# Patient Record
Sex: Male | Born: 1955
Health system: Southern US, Community
[De-identification: ages and names within clinical notes are randomized; demographics above are authoritative.]

## PROBLEM LIST (undated history)

## (undated) DIAGNOSIS — R748 Abnormal levels of other serum enzymes: Secondary | ICD-10-CM

## (undated) DIAGNOSIS — E785 Hyperlipidemia, unspecified: Secondary | ICD-10-CM

## (undated) DIAGNOSIS — I2510 Atherosclerotic heart disease of native coronary artery without angina pectoris: Secondary | ICD-10-CM

## (undated) DIAGNOSIS — I219 Acute myocardial infarction, unspecified: Secondary | ICD-10-CM

## (undated) DIAGNOSIS — E611 Iron deficiency: Secondary | ICD-10-CM

## (undated) HISTORY — DX: Hyperlipidemia, unspecified: E78.5

## (undated) HISTORY — DX: Iron deficiency: E61.1

## (undated) HISTORY — DX: Abnormal levels of other serum enzymes: R74.8

---

## 1999-01-10 ENCOUNTER — Emergency Department (HOSPITAL_COMMUNITY): Admission: EM | Admit: 1999-01-10 | Discharge: 1999-01-10 | Payer: Self-pay | Admitting: Emergency Medicine

## 2005-01-22 ENCOUNTER — Emergency Department (HOSPITAL_COMMUNITY): Admission: EM | Admit: 2005-01-22 | Discharge: 2005-01-22 | Payer: Self-pay | Admitting: Emergency Medicine

## 2014-02-11 ENCOUNTER — Emergency Department (HOSPITAL_COMMUNITY): Payer: Self-pay

## 2014-02-11 ENCOUNTER — Emergency Department (HOSPITAL_COMMUNITY)
Admission: EM | Admit: 2014-02-11 | Discharge: 2014-02-11 | Disposition: A | Discharge status: Home / Self Care | Payer: Self-pay | Attending: Emergency Medicine | Admitting: Emergency Medicine

## 2014-02-11 ENCOUNTER — Encounter (HOSPITAL_COMMUNITY): Payer: Self-pay | Admitting: Emergency Medicine

## 2014-02-11 DIAGNOSIS — N451 Epididymitis: Secondary | ICD-10-CM

## 2014-02-11 DIAGNOSIS — R110 Nausea: Secondary | ICD-10-CM | POA: Insufficient documentation

## 2014-02-11 DIAGNOSIS — N453 Epididymo-orchitis: Secondary | ICD-10-CM | POA: Insufficient documentation

## 2014-02-11 DIAGNOSIS — F17200 Nicotine dependence, unspecified, uncomplicated: Secondary | ICD-10-CM | POA: Insufficient documentation

## 2014-02-11 LAB — URINALYSIS, ROUTINE W REFLEX MICROSCOPIC
Glucose, UA: NEGATIVE mg/dL
Hgb urine dipstick: NEGATIVE
Ketones, ur: 15 mg/dL — AB
NITRITE: NEGATIVE
Protein, ur: NEGATIVE mg/dL
SPECIFIC GRAVITY, URINE: 1.031 — AB (ref 1.005–1.030)
Urobilinogen, UA: 1 mg/dL (ref 0.0–1.0)
pH: 6 (ref 5.0–8.0)

## 2014-02-11 LAB — URINE MICROSCOPIC-ADD ON

## 2014-02-11 MED ORDER — FENTANYL CITRATE 0.05 MG/ML IJ SOLN
50.0000 ug | INTRAMUSCULAR | Status: AC | PRN
  Administered 2014-02-11 (×2): 50 ug via INTRAVENOUS
  Filled 2014-02-11: qty 2

## 2014-02-11 MED ORDER — CEFTRIAXONE SODIUM 250 MG IJ SOLR
250.0000 mg | Freq: Once | INTRAMUSCULAR | Status: AC
  Administered 2014-02-11: 250 mg via INTRAMUSCULAR
  Filled 2014-02-11: qty 250

## 2014-02-11 MED ORDER — ONDANSETRON HCL 4 MG/2ML IJ SOLN
4.0000 mg | INTRAMUSCULAR | Status: DC | PRN
  Administered 2014-02-11: 4 mg via INTRAVENOUS
  Filled 2014-02-11: qty 2

## 2014-02-11 MED ORDER — DOXYCYCLINE HYCLATE 100 MG PO TABS: 100.0000 mg | ORAL_TABLET | Freq: Two times a day (BID) | ORAL | Status: DC

## 2014-02-11 MED ORDER — OXYCODONE-ACETAMINOPHEN 5-325 MG PO TABS: ORAL_TABLET | ORAL | Status: DC

## 2014-02-11 MED ORDER — DOXYCYCLINE HYCLATE 100 MG PO TABS
100.0000 mg | ORAL_TABLET | Freq: Once | ORAL | Status: AC
  Administered 2014-02-11: 100 mg via ORAL
  Filled 2014-02-11: qty 1

## 2014-02-11 MED ORDER — LIDOCAINE HCL (PF) 1 % IJ SOLN
INTRAMUSCULAR | Status: AC
  Administered 2014-02-11: 1.5 mL
  Filled 2014-02-11: qty 5

## 2014-02-11 NOTE — ED Notes (Signed)
Patient transported to CT 

## 2014-02-11 NOTE — ED Provider Notes (Signed)
CSN: [id number]     Arrival date & time 02/11/14  1501 History   First MD Initiated Contact with Patient 02/11/14 1531     Chief Complaint  Patient presents with  . Groin Pain      HPI Pt was seen at 1545. Per pt, c/o sudden onset and persistence of constant right sided testicular "pain" for the past 3 to 4 days. Pt describes the pain as "throbbing," "tightness," "sharp," "shooting," and radiating into his right lower back.  Has been associated with nausea.  Denies left testicular pain/swelling, no dysuria/hematuria, no penile discharge, no abd pain, no vomiting/diarrhea, no black or blood in stools, no CP/SOB.     History reviewed. No pertinent past medical history.  History reviewed. No pertinent past surgical history.  History  Substance Use Topics  . Smoking status: Current Every Day Smoker    Types: Cigarettes  . Smokeless tobacco: Not on file  . Alcohol Use: Yes    Review of Systems ROS: Statement: All systems negative except as marked or noted in the HPI; Constitutional: Negative for fever and chills. ; ; Eyes: Negative for eye pain, redness and discharge. ; ; ENMT: Negative for ear pain, hoarseness, nasal congestion, sinus pressure and sore throat. ; ; Cardiovascular: Negative for chest pain, palpitations, diaphoresis, dyspnea and peripheral edema. ; ; Respiratory: Negative for cough, wheezing and stridor. ; ; Gastrointestinal: Negative for nausea, vomiting, diarrhea, abdominal pain, blood in stool, hematemesis, jaundice and rectal bleeding. . ; ; Genitourinary: Negative for dysuria, flank pain and hematuria. ; ; Genital:  No penile drainage or rash, +right testicular pain and swelling, no scrotal rash or swelling.;;  Musculoskeletal: +right sided LBP. Negative for neck pain. Negative for swelling and trauma.; ; Skin: Negative for pruritus, rash, abrasions, blisters, bruising and skin lesion.; ; Neuro: Negative for headache, lightheadedness and neck stiffness. Negative for  weakness, altered level of consciousness , altered mental status, extremity weakness, paresthesias, involuntary movement, seizure and syncope.     Allergies  Review of patient's allergies indicates no known allergies.  Home Medications   Prior to Admission medications   Medication Sig Start Date End Date Taking? Authorizing Provider  ibuprofen (ADVIL,MOTRIN) 200 MG tablet Take 400 mg by mouth every 6 (six) hours as needed for mild pain.   Yes Historical Provider, MD   BP 131/68  Pulse 100  Temp(Src) 99.1 F (37.3 C) (Oral)  Resp 19  Ht 5\' 10"  (1.778 m)  Wt 146 lb 8 oz (66.452 kg)  BMI 21.02 kg/m2  SpO2 100% Physical Exam 1550: Physical examination:  Nursing notes reviewed; Vital signs and O2 SAT reviewed;  Constitutional: Well developed, Well nourished, Well hydrated, In no acute distress; Head:  Normocephalic, atraumatic; Eyes: EOMI, PERRL, No scleral icterus; ENMT: Mouth and pharynx normal, Mucous membranes moist; Neck: Supple, Full range of motion, No lymphadenopathy; Cardiovascular: Regular rate and rhythm, No gallop; Respiratory: Breath sounds clear & equal bilaterally, No wheezes.  Speaking full sentences with ease, Normal respiratory effort/excursion; Chest: Nontender, Movement normal; Abdomen: Soft, Nontender, Nondistended, Normal bowel sounds; Genitourinary: No CVA tenderness. Genital exam performed with pt permission and male ED Tech chaperone present during exam. No perineal erythema.  No penile lesions or drainage.  No scrotal erythema, +right posterior scrotal edema and tenderness to palp. No rash. Normal testicular lie. +right posterior testicular tenderness to palp.  +cremasteric reflexes bilat.  No inguinal LAN or palpable masses.;; Spine:  No midline CS, TS, LS tenderness. +mild TTP right lower lumbar  paraspinal muscles. No rash.;; Extremities: Pulses normal, No tenderness, No edema, No calf edema or asymmetry.; Neuro: AA&Ox3, Major CN grossly intact.  Speech clear. No gross  focal motor or sensory deficits in extremities. Climbs on and off stretcher easily by himself. Gait steady.; Skin: Color normal, Warm, Dry.   ED Course  Procedures    MDM  MDM Reviewed: previous chart, nursing note and vitals Reviewed previous: labs Interpretation: labs, ultrasound and CT scan    Results for orders placed during the hospital encounter of 02/11/14  URINALYSIS, ROUTINE W REFLEX MICROSCOPIC      Result Value Ref Range   Color, Urine AMBER (*) YELLOW   APPearance HAZY (*) CLEAR   Specific Gravity, Urine 1.031 (*) 1.005 - 1.030   pH 6.0  5.0 - 8.0   Glucose, UA NEGATIVE  NEGATIVE mg/dL   Hgb urine dipstick NEGATIVE  NEGATIVE   Bilirubin Urine SMALL (*) NEGATIVE   Ketones, ur 15 (*) NEGATIVE mg/dL   Protein, ur NEGATIVE  NEGATIVE mg/dL   Urobilinogen, UA 1.0  0.0 - 1.0 mg/dL   Nitrite NEGATIVE  NEGATIVE   Leukocytes, UA SMALL (*) NEGATIVE  URINE MICROSCOPIC-ADD ON      Result Value Ref Range   Squamous Epithelial / LPF FEW (*) RARE   WBC, UA 7-10  <3 WBC/hpf   RBC / HPF 0-2  <3 RBC/hpf   Bacteria, UA RARE  RARE   Ct Abdomen Pelvis Wo Contrast 02/11/2014   CLINICAL DATA:  Right groin pain radiates into right flank and lower back  EXAM: CT ABDOMEN AND PELVIS WITHOUT CONTRAST  TECHNIQUE: Multidetector CT imaging of the abdomen and pelvis was performed following the standard protocol without IV contrast.  COMPARISON:  None.  FINDINGS: Visualized portions of the lung bases are clear. There is a small hiatal hernia.  Liver is normal. Gallbladder is normal. Spleen is normal. Pancreas is normal.  Adrenal glands are normal. Left kidney is normal. Right kidney is normal except for 1 mm nonobstructing stone in the lower pole. There is no nephroureterolithiasis or hydroureteronephrosis.  Bladder and reproductive organs are normal. There is no free fluid in the abdomen or pelvis. There is no significant adenopathy. Bowel and appendix appear normal. There are no acute  musculoskeletal findings.  Aorta is not dilated. There is mild bilateral iliac calcification. There is minimal calcification of the aorta.  IMPRESSION: No acute findings. Small hiatal hernia. 1 mm nonobstructing stone right kidney.   Electronically Signed   By: Esperanza Heir M.D.   On: 02/11/2014 17:05   US Scrotum 02/11/2014   CLINICAL DATA:  Right testicle pain, swelling.  EXAM: ULTRASOUND OF SCROTUM  TECHNIQUE: Complete ultrasound examination of the testicles, epididymis, and other scrotal structures was performed.  COMPARISON:  None.  FINDINGS: Right testicle  Measurements: 4.5 x 3.1 x 3.4 cm. No mass or microlithiasis visualized. Normal color Doppler flow.  Left testicle  Measurements: 4.3 x 2.4 x 2.6 cm. No mass or microlithiasis visualized. Normal color Doppler flow.  Right epididymis:  Increased blood flow, enlarged and heterogeneous.  Left epididymis:  3 small cysts, less than 1 cm maximally  Hydrocele:  Bilateral, right larger than left  Varicocele:  Mild left varicocele.  IMPRESSION: Enlarged, hyperemic right epididymis compatible with epididymitis.  Bilateral hydroceles right greater than left.  Left varicocele.   Electronically Signed   By: Charlett Nose M.D.   On: 02/11/2014 17:00    1750:   Feels better after pain meds  and wants to go home now. Will tx with IM rocephin, PO doxycycline for right epididymitis. Dx and testing d/w pt and family.  Questions answered.  Verb understanding, agreeable to d/c home with outpt f/u.   Laray AngerKathleen M McManus, DO 02/13/14 1537

## 2014-02-11 NOTE — ED Notes (Signed)
Pt to ultrasound

## 2014-02-11 NOTE — ED Notes (Signed)
[facility]

## 2014-02-11 NOTE — ED Notes (Signed)
He states hes noticed mild pain in his groin but today the pain became so severe he can not sit down and he is having difficulty voiding.

## 2014-02-11 NOTE — Discharge Instructions (Signed)
°Emergency Department Resource Guide °1) Find a Doctor and Pay Out of Pocket °Although you won't have to find out who is covered by your insurance plan, it is a good idea to ask around and get recommendations. You will then need to call the office and see if the doctor you have chosen will accept you as a new patient and what types of options they offer for patients who are self-pay. Some doctors offer discounts or will set up payment plans for their patients who do not have insurance, but you will need to ask so you aren't surprised when you get to your appointment. ° °2) Contact Your Local Health Department °Not all health departments have doctors that can see patients for sick visits, but many do, so it is worth a call to see if yours does. If you don't know where your local health department is, you can check in your phone book. The CDC also has a tool to help you locate your state's health department, and many state websites also have listings of all of their local health departments. ° °3) Find a Walk-in Clinic °If your illness is not likely to be very severe or complicated, you may want to try a walk in clinic. These are popping up all over the country in pharmacies, drugstores, and shopping centers. They're usually staffed by nurse practitioners or physician assistants that have been trained to treat common illnesses and complaints. They're usually fairly quick and inexpensive. However, if you have serious medical issues or chronic medical problems, these are probably not your best option. ° °No Primary Care Doctor: °- Call Health Connect at  832-8000 - they can help you locate a primary care doctor that  accepts your insurance, provides certain services, etc. °- Physician Referral Service- 1-800-533-3463 ° °Chronic Pain Problems: °Organization         Address  Phone   Notes  °[facility]  ([phone]) [phone] Patients need to be referred by their primary care doctor.  ° °Medication  Assistance: °Organization         Address  Phone   Notes  °[place] [facility] [address] °[place], [place] [address] (336) 641-8030 --Must be a resident of [place] °-- Must have NO insurance coverage whatsoever (no Medicaid/ Medicare, etc.) °-- The pt. MUST have a primary care doctor that directs their care regularly and follows them in the community °  °[facility]  (866) 331-1348   °[employer]  (888) 892-1162   ° °Agencies that provide inexpensive medical care: °Organization         Address  Phone   Notes  °[facility]  (336) 832-8035   °[facility]    (336) 832-7272   °[facility] [address] °[place], [place] [address] (336) 832-4777   °[facility] [address], °[place] (336) 271-4999   °[facility]    (336) 373-0678   °[facility]    (336) 272-1050   °[facility] ° [address], [place] Phone:  (336) 832-4444, Fax:  (336) 832-4440 Hours of Operation:  9 am - 6 pm, M-F.  Also accepts Medicaid/Medicare and self-pay.  °[facility] ° [address], [place] Phone: (336) [phone], Fax: (336) [phone]. Hours of Operation:  8:30 am - 5:30 pm, M-F.  Also accepts Medicaid and self-pay.  °[facility] [place] [address], [place] Phone: (336) 878-6027   °[facility] [address], [place], [place] (336)723-1848, Ext. 123 Mondays & Thursdays: 7-9 AM.  First 15 patients are seen on a first come, first serve basis. °  ° °Medicaid-accepting [place] Providers: ° °Organization         Address  Phone   Notes  °[facility] [address], [place] (336) 641-2100 Also accepts self-pay patients.  °[facility] [address], [place] ° (336) 856-9996   °[facility] [address], [place]  (336) 288-8857   °[facility] [address] [place] [address], [place] (336) 299-7000   °Veita Bland [address], [place]  ° (336) 373-1557 Only accepts [place] Access Medicaid patients after they have their name applied to their card.  ° °Self-Pay (no insurance) in [place]: ° °Organization         Address  Phone   Notes  °Sickle Cell Patients, [facility] [address], [place] (336) 832-1970   °[facility] [address], [place] (336) 832-4400   °[facility] ° [address], [place] (336) 992-4800   °[facility]/Dr. Osei-Bonsu ° [address] [place] [address], [place] or [address], [place] (336) [phone] Phone number for both [place] and [place] locations is the same.  °[facility] [address], [place] (336) 299-0000   °[facility] [address] [place] [address], [place] or [address] (336) 852-7530 °(336) 878-2260   °[facility] [address], [place] (336) 350-1642, phone; (336) 294-5005, fax Sees patients 1st and 3rd Saturday of every month.  Must not qualify for public or private insurance (i.e. Medicaid, Medicare, [place] Health Choice, Veterans' Benefits) • Household income should be no more than 200% of the poverty level •The clinic cannot treat you if you are pregnant or think you are pregnant • Sexually transmitted diseases are not treated at the clinic.  ° ° °Dental Care: °Organization         Address  Phone  Notes  °[place] [facility] [address], [place] (336) 641-6152 Accepts children up to age 21 who are enrolled in Medicaid or [place] Health Choice; pregnant women with a Medicaid card; and children who have applied for Medicaid or [place] Health Choice, but were declined, whose parents can pay a reduced fee at time of service.  °[place]  [facility] [place]  [address], [place] (336) 641-7733 Accepts children up to age 21 who are enrolled in Medicaid or [place] Health Choice; pregnant women with a Medicaid card; and children who have applied for Medicaid or [place] Health Choice, but were declined, whose parents can pay a reduced fee at time of service.  °[facility] ° [address], [place] (336) 641-4533 Patients are seen by appointment only. Walk-ins are not accepted. [facility] will see patients 18 years of age and older. °Monday - Tuesday (8am-5pm) °Most Wednesdays (8:30-5pm) °$30 per visit, cash only  °[facility] ° [address], [place] (336) 641-4533 Patients are seen by appointment only. Walk-ins are not accepted. [facility] will see patients 18 years of age and older. °One   Wednesday Evening (Monthly: Volunteer Based).  $30 per visit, cash only  °[facility]  (919) [phone] for adults; Children under age 4, call Graduate Pediatric Dentistry at ([phone]) [phone]. Children aged 4-14, please call ([phone]) [phone] to request a pediatric application. ° Dental services are provided in all areas of dental care including fillings, crowns and bridges, complete and partial dentures, implants, gum treatment, root canals, and extractions. Preventive care is also provided. Treatment is provided to both adults and children. °Patients are selected via a lottery and there is often a waiting list. °  °[facility] [address], °[place] ° (336) 763-8833 [email or web address] °  °[facility] [address], [place], [place] (336)723-1848, Ext. 123 Second and Fourth Thursday of each month, opens at 6:30 AM; Clinic ends at 9 AM.  Patients are seen on a first-come first-served basis, and a limited number are seen during each clinic.  ° °[facility] ° [address], [place], [place] (336) [phone]    Eligibility Requirements °You must have lived in [place], [place], or [place] counties for at least the last three months. °  You cannot be eligible for state or federal sponsored healthcare insurance, including Veterans Administration, Medicaid, or Medicare. °  You generally cannot be eligible for healthcare insurance through your employer.  °  How to apply: °Eligibility screenings are held every Tuesday and Wednesday afternoon from 1:00 pm until 4:00 pm. You do not need an appointment for the interview!  °[facility] [address], [place], [place] 336-631-2330   °[facility]  336-342-8273   °[facility]  336-703-3100   °[facility]  336-570-6415   ° °Behavioral Health Resources in the Community: °Intensive Outpatient Programs °Organization         Address  Phone  Notes  °[facility] [address], [place], [place] 336-878-6098   °[facility] [address], [place], [place] 336-832-9800   °[facility] [address], [place], [place] ° 336-882-2125   °[facility] [address],  °[place], [place] 1-800-853-5163 or 336-641-4981   °Substance Abuse Resources °Organization         Address  Phone  Notes  °[facility]  336-882-2125   °[facility]  336-784-9470   °[facility]  336-285-9073   °[facility]  336-845-3988   °Residential & Outpatient Substance Abuse Program  1-800-659-3381   °Psychological Services °Organization         Address  Phone  Notes  °[facility]  336- 832-9600   °[facility]  336- 378-7881   °[facility] [address], [place] 1-800-853-5163 or 336-641-4981   ° °Mobile Crisis Teams °Organization         Address  Phone  Notes  °[facility], Mobile Crisis Care Unit  1-877-626-1772   °Assertive °[facility] ° [address]  [place], [place] 336-834-9664   °Sharon DeEsch [address] °[place] 336-554-5454   ° °Self-Help/Support Groups °Organization         Address  Phone             Notes  °[facility] of  - variety of support groups  336- 373-1402 Call for more information  °Narcotics Anonymous (NA), [facility] [address], °[place]  2 meetings at this location  ° °  Residential Treatment Programs Organization         Address  Phone  Notes  [facility] [address],    [place]  [phone]   [facility]  [address], [place] [phone], [place], [place] [phone]   [facility] [address], [place] [phone] Admissions: 8am-3pm M-F  [facility] [address]    [place], [place] [phone]   [facility] [address], [place], [place] [phone]   [facility] [address]  [place], [place] [phone]   [facility] Intensive Outpatient [address], [place], [place] [phone]   [facility] [address]  [place], [place] [phone] or [phone](757)[phone]   [facility] [address] [place], [place] [phone]([phone] Accepts Medicaid  [facility] [address]  [place] [phone] Substance Abuse/Addiction Treatment   [facility] Organization         Address  Phone  Notes  [facility]  (815)482-8371(888) 707-543-8369   Angie FavaJulie Brannon, PhD [address], [address] [place], [place]   (657) [phone](336) [phone] or [phone](336) [phone]   [facility]   [address] [place], [place] [phone](336) [phone]   [facility] [address], [place], [place] ([phone]) [phone] Insurance/Medicaid/sponsorship through [facility] [address]                                    [place], [place] [phone]) [phone] Therapy/tele-psych/case    [facility] [address]   [place], [place] [phone]) [phone]    Dr. Lolly MustacheArfeen  [phone]) [phone]   [facility]  [employer] [facility] 1) [address],  2) [address], [place] 3)  [address], [place] ([phone](336) ([phone](336) ([phone]  [phone](336) [phone]   [facility] Child Abuse Hot[facility] 250-700-1392(336) [phone] or [phone](336) [phone] (After Hours)       Take the prescriptions as directed.  Call the Urologist on Monday to schedule a follow up appointment within the next 3 days.  Return to the Emergency Department immediately sooner if worsening.

## 2014-02-12 LAB — URINE CULTURE
Colony Count: NO GROWTH
Culture: NO GROWTH

## 2014-02-13 ENCOUNTER — Emergency Department (HOSPITAL_COMMUNITY)
Admission: EM | Admit: 2014-02-13 | Discharge: 2014-02-13 | Disposition: A | Discharge status: Home / Self Care | Payer: Self-pay | Attending: Emergency Medicine | Admitting: Emergency Medicine

## 2014-02-13 ENCOUNTER — Encounter (HOSPITAL_COMMUNITY): Payer: Self-pay | Admitting: Emergency Medicine

## 2014-02-13 DIAGNOSIS — F17200 Nicotine dependence, unspecified, uncomplicated: Secondary | ICD-10-CM | POA: Insufficient documentation

## 2014-02-13 DIAGNOSIS — I861 Scrotal varices: Secondary | ICD-10-CM | POA: Insufficient documentation

## 2014-02-13 DIAGNOSIS — N200 Calculus of kidney: Secondary | ICD-10-CM | POA: Insufficient documentation

## 2014-02-13 DIAGNOSIS — N451 Epididymitis: Secondary | ICD-10-CM

## 2014-02-13 DIAGNOSIS — N453 Epididymo-orchitis: Secondary | ICD-10-CM | POA: Insufficient documentation

## 2014-02-13 DIAGNOSIS — N433 Hydrocele, unspecified: Secondary | ICD-10-CM | POA: Insufficient documentation

## 2014-02-13 DIAGNOSIS — Z792 Long term (current) use of antibiotics: Secondary | ICD-10-CM | POA: Insufficient documentation

## 2014-02-13 LAB — URINALYSIS, ROUTINE W REFLEX MICROSCOPIC
Bilirubin Urine: NEGATIVE
GLUCOSE, UA: NEGATIVE mg/dL
KETONES UR: 15 mg/dL — AB
Nitrite: NEGATIVE
PROTEIN: 30 mg/dL — AB
Specific Gravity, Urine: 1.02 (ref 1.005–1.030)
Urobilinogen, UA: 1 mg/dL (ref 0.0–1.0)
pH: 5.5 (ref 5.0–8.0)

## 2014-02-13 LAB — URINE MICROSCOPIC-ADD ON

## 2014-02-13 MED ORDER — OXYCODONE-ACETAMINOPHEN 5-325 MG PO TABS
1.0000 | ORAL_TABLET | Freq: Once | ORAL | Status: AC
  Administered 2014-02-13: 1 via ORAL
  Filled 2014-02-13: qty 1

## 2014-02-13 NOTE — ED Notes (Signed)
Patient was going to be taken out by wheelchair but he refused. He was able to ambulate without difficulty and significant other was with him.

## 2014-02-13 NOTE — ED Provider Notes (Signed)
CSN: [id number]     Arrival date & time 02/13/14  5621 History   First MD Initiated Contact with Patient 02/13/14 1131     Chief Complaint  Patient presents with  . Testicle Pain     (Consider location/radiation/quality/duration/timing/severity/associated sxs/prior Treatment) The history is provided by the patient. No language interpreter was used.  Eddie Joseph is a 58 year old male with no known stent in past medical history presenting to the ED with testicular pain. Reported that he's noticed more swelling to the right side of his testicle, reported that the hardness says it is irritated, reported that the testicles more soft than it was when he was last in ED setting. Reported that he noticed discomfort radiating into his right groin. Stated that he felt mildly warm this morning-when asked what his temperature was, patient reported does not have a thermometer to take his temperature. Patient was last seen in ED setting on 02/11/2014 for similar complaints-CT abdomen and pelvis was performed with findings of the small hiatal hernia with right kidney stone identified. Ultrasound of the scrotum identified right epididymitis, bilateral hydroceles, left varicocele. Patient was given antibiotics in ED setting and discharged home with Doxy. As per patient, reported that he only took one dose of doxycycline yesterday. Denied ear edema, red streaks, nausea, vomiting, diarrhea, abdominal pain, numbness, tingling, headache, blurred vision, sudden loss of vision. PCP none  History reviewed. No pertinent past medical history. History reviewed. No pertinent past surgical history. No family history on file. History  Substance Use Topics  . Smoking status: Current Every Day Smoker    Types: Cigarettes  . Smokeless tobacco: Not on file  . Alcohol Use: Yes    Review of Systems  Constitutional: Positive for fever (Subjective). Negative for chills.  Respiratory: Negative for chest tightness and  shortness of breath.   Cardiovascular: Negative for chest pain.  Gastrointestinal: Negative for nausea, vomiting, abdominal pain and diarrhea.  Genitourinary: Positive for scrotal swelling and testicular pain. Negative for urgency, hematuria, flank pain, decreased urine volume, discharge, penile swelling and penile pain.  Musculoskeletal: Negative for back pain and neck pain.  Neurological: Negative for dizziness, weakness and headaches.      Allergies  Review of patient's allergies indicates no known allergies.  Home Medications   Prior to Admission medications   Medication Sig Start Date End Date Taking? Authorizing Provider  doxycycline (VIBRA-TABS) 100 MG tablet Take 1 tablet (100 mg total) by mouth 2 (two) times daily. 02/11/14  Yes Laray Anger, DO  ibuprofen (ADVIL,MOTRIN) 200 MG tablet Take 200-400 mg by mouth every 6 (six) hours as needed.   Yes Historical Provider, MD  oxyCODONE-acetaminophen (PERCOCET/ROXICET) 5-325 MG per tablet Take 1-2 tablets by mouth every 4 (four) hours as needed for severe pain.   Yes Historical Provider, MD   BP 152/80  Pulse 113  Temp(Src) 98.5 F (36.9 C) (Oral)  Resp 20  Ht 5\' 10"  (1.778 m)  Wt 147 lb (66.679 kg)  BMI 21.09 kg/m2  SpO2 97% Physical Exam  Nursing note and vitals reviewed. Constitutional: He is oriented to person, place, and time. He appears well-developed and well-nourished. No distress.  HENT:  Head: Normocephalic and atraumatic.  Mouth/Throat: Oropharynx is clear and moist. No oropharyngeal exudate.  Eyes: Conjunctivae and EOM are normal. Pupils are equal, round, and reactive to light. Right eye exhibits no discharge. Left eye exhibits no discharge.  Neck: Normal range of motion. Neck supple. No tracheal deviation present.  Negative  neck stiffness Negative nuchal rigidity Negative cervical lymphadenopathy Negative meningeal signs  Cardiovascular: Normal rate, regular rhythm and normal heart sounds.   Pulses:       Radial pulses are 2+ on the right side, and 2+ on the left side.       Dorsalis pedis pulses are 2+ on the right side, and 2+ on the left side.  Pulmonary/Chest: Effort normal and breath sounds normal. No respiratory distress. He has no wheezes. He has no rales.  Abdominal: Soft. Bowel sounds are normal. He exhibits no distension. There is no tenderness. There is no rebound and no guarding.  Negative abdominal distention Bowel sounds normal active in all 4 quadrants Abdomen soft upon palpation Negative pain upon palpation to the abdomen  Genitourinary:  Pelvic exam: Swelling, erythema, warmth upon palpation identified to the right testicle with most discomfort upon palpation to the posterior aspect. Negative high riding testicle identified. Mildly hard upon palpation, no sign of abscess noted. Negative fluctuance identified or induration. Mild hydrocele identified to the left testicle. Negative varicocele identified. Negative inguinal lymphadenopathy bilaterally. Negative penile swelling, discharge identified. Negative active bleeding identified. Exam chaperoned with tech  Musculoskeletal: Normal range of motion.  Full ROM to upper and lower extremities without difficulty noted, negative ataxia noted.  Lymphadenopathy:    He has no cervical adenopathy.  Neurological: He is alert and oriented to person, place, and time. No cranial nerve deficit. He exhibits normal muscle tone. Coordination normal.  Skin: Skin is warm and dry. No rash noted. He is not diaphoretic. No erythema.  Psychiatric: He has a normal mood and affect. His behavior is normal. Thought content normal.    ED Course  Procedures (including critical care time)  Results for orders placed during the hospital encounter of 02/13/14  URINALYSIS, ROUTINE W REFLEX MICROSCOPIC      Result Value Ref Range   Color, Urine YELLOW  YELLOW   APPearance CLEAR  CLEAR   Specific Gravity, Urine 1.020  1.005 - 1.030   pH 5.5  5.0 - 8.0    Glucose, UA NEGATIVE  NEGATIVE mg/dL   Hgb urine dipstick TRACE (*) NEGATIVE   Bilirubin Urine NEGATIVE  NEGATIVE   Ketones, ur 15 (*) NEGATIVE mg/dL   Protein, ur 30 (*) NEGATIVE mg/dL   Urobilinogen, UA 1.0  0.0 - 1.0 mg/dL   Nitrite NEGATIVE  NEGATIVE   Leukocytes, UA SMALL (*) NEGATIVE  URINE MICROSCOPIC-ADD ON      Result Value Ref Range   Squamous Epithelial / LPF RARE  RARE   WBC, UA 3-6  <3 WBC/hpf   RBC / HPF 0-2  <3 RBC/hpf   Bacteria, UA FEW (*) RARE    Labs Review Labs Reviewed  URINALYSIS, ROUTINE W REFLEX MICROSCOPIC - Abnormal; Notable for the following:    Hgb urine dipstick TRACE (*)    Ketones, ur 15 (*)    Protein, ur 30 (*)    Leukocytes, UA SMALL (*)    All other components within normal limits  URINE MICROSCOPIC-ADD ON - Abnormal; Notable for the following:    Bacteria, UA FEW (*)    All other components within normal limits    Imaging Review Ct Abdomen Pelvis Wo Contrast  02/11/2014   CLINICAL DATA:  Right groin pain radiates into right flank and lower back  EXAM: CT ABDOMEN AND PELVIS WITHOUT CONTRAST  TECHNIQUE: Multidetector CT imaging of the abdomen and pelvis was performed following the standard protocol without IV contrast.  COMPARISON:  None.  FINDINGS: Visualized portions of the lung bases are clear. There is a small hiatal hernia.  Liver is normal. Gallbladder is normal. Spleen is normal. Pancreas is normal.  Adrenal glands are normal. Left kidney is normal. Right kidney is normal except for 1 mm nonobstructing stone in the lower pole. There is no nephroureterolithiasis or hydroureteronephrosis.  Bladder and reproductive organs are normal. There is no free fluid in the abdomen or pelvis. There is no significant adenopathy. Bowel and appendix appear normal. There are no acute musculoskeletal findings.  Aorta is not dilated. There is mild bilateral iliac calcification. There is minimal calcification of the aorta.  IMPRESSION: No acute findings. Small  hiatal hernia. 1 mm nonobstructing stone right kidney.   Electronically Signed   By: Esperanza Heiraymond  Rubner M.D.   On: 02/11/2014 17:05   [place] Scrotum  02/11/2014   CLINICAL DATA:  Right testicle pain, swelling.  EXAM: ULTRASOUND OF SCROTUM  TECHNIQUE: Complete ultrasound examination of the testicles, epididymis, and other scrotal structures was performed.  COMPARISON:  None.  FINDINGS: Right testicle  Measurements: 4.5 x 3.1 x 3.4 cm. No mass or microlithiasis visualized. Normal color Doppler flow.  Left testicle  Measurements: 4.3 x 2.4 x 2.6 cm. No mass or microlithiasis visualized. Normal color Doppler flow.  Right epididymis:  Increased blood flow, enlarged and heterogeneous.  Left epididymis:  3 small cysts, less than 1 cm maximally  Hydrocele:  Bilateral, right larger than left  Varicocele:  Mild left varicocele.  IMPRESSION: Enlarged, hyperemic right epididymis compatible with epididymitis.  Bilateral hydroceles right greater than left.  Left varicocele.   Electronically Signed   By: Charlett NoseKevin  Dover M.D.   On: 02/11/2014 17:00     EKG Interpretation None      MDM   Final diagnoses:  Epididymitis  Bilateral hydrocele  Left varicocele  Right nephrolithiasis    Medications  oxyCODONE-acetaminophen (PERCOCET/ROXICET) 5-325 MG per tablet 1 tablet (1 tablet Oral Given 02/13/14 1256)   Filed Vitals:   02/13/14 1152 02/13/14 1200 02/13/14 1215 02/13/14 1230  BP: 145/90 138/77 140/78 152/80  Pulse:      Temp:      TempSrc:      Resp:      Height:      Weight:      SpO2:       This provider reviewed patient's chart. Patient was seen and assessed on 02/11/2014 regarding right testicular pain. Patient had a CT abdomen and pelvis performed with findings of hiatal hernia as well as a right kidney stone. Patient had an ultrasound of the scrotum performed identified enlarged right epididymitis as well as bilateral hydroceles, right greater than left as well as left varicocele. Negative findings of  testicular torsion noted. Patient was given antibiotics in ED setting and discharged home with doxycycline, Percocet, ibuprofen with recommendation for urology referral. Urinalysis identified trace of hemoglobin-patient does have findings of right nephrolithiasis. Small leukocytes identified with a blood cell count 3-6. Urine will be sent off for culturing. When compared to previous urine specimen from previous visit, unchanged.  Patient's heart rate stable, patient continues to move around when vitals are being performed. When resting heart rate is in the 90's. Patient had normal rhythm on exam.  Patient has known diagnosis of right epididymitis. Doubt torsion. This provider wanted to obtain bloodwork, patient refused labs to be performed in the ED setting, reported that he would like to go home. Patient stable, afebrile. Patient not septic appearing. Discussed case  with attending physician, Dr. Salley ScarletW. Plunkett who did not recommend labs and recommended patient to be discharge home with Urology follow-up. Patient discharged. Discussed with patient to continue to use antibiotics and pain medications as prescribed. Discussed importance of following up with Urology - to call tomorrow. Reported that he has pain medications at home. Discussed with patient to rest, elevate, and cool compressions. Discussed with patient to closely monitor symptoms and if symptoms are to worsen or change to report back to the ED - strict return instructions given.  Patient agreed to plan of care, understood, all questions answered.   Raymon MuttonMarissa Sciacca, PA-C 02/13/14 2059  Raymon MuttonMarissa Sciacca, PA-C 02/14/14 [phone]

## 2014-02-13 NOTE — ED Notes (Signed)
Patient is standing at his door expressing that he is in pain and needs pain medication. PA informed.

## 2014-02-13 NOTE — ED Notes (Addendum)
Patient is unable to sit still and states "I am ready to go home, I have my pain medication in my truck that I am ready to take". Informed patient that he still needs to take his medication as prescribed in correlation to the medication that we gave to him here. I asked him what pain medication he is taking he stated Ibuprofen.

## 2014-02-13 NOTE — ED Notes (Signed)
Pt dx with epididymis to right testicle on 6/19, started on pain medication and antibiotic. Pt reports decrease in pain but states that testicle remains swollen. Denies urinary symptoms. States testicle is soft. Reports cold sweat this AM. NAD.

## 2014-02-13 NOTE — Discharge Instructions (Signed)
Please call and set up an appointment with urology tomorrow Please rest and stay hydrated Please continue to take antibiotics as prescribed-please take on a full stomach Please continue to take pain medications as prescribed while on pain medications there is to be no drinking alcohol, driving, operating any heavy machinery if there is extra please dispose in a proper manner. Please do not take any extra Tylenol for this can lead to Tylenol overdose and liver issues. Please avoid any physical or shortness activity Please rest and elevate the testicle to aid in relief Please continue to monitor symptoms closely if symptoms are to worsen or change (fever greater than 101, chills, sweating, nausea, vomiting, stomach pain, groin pain, worsening or changes to testicle appearance, hot to the touch, penile discharge or bleeding, swelling to the penis, red extremities, numbness, tingling) please report back to the ED immediately   Epididymitis Epididymitis is a swelling (inflammation) of the epididymis. The epididymis is a cord-like structure along the back part of the testicle. Epididymitis is usually, but not always, caused by infection. This is usually a sudden problem beginning with chills, fever and pain behind the scrotum and in the testicle. There may be swelling and redness of the testicle. DIAGNOSIS  Physical examination will reveal a tender, swollen epididymis. Sometimes, cultures are obtained from the urine or from prostate secretions to help find out if there is an infection or if the cause is a different problem. Sometimes, blood work is performed to see if your white blood cell count is elevated and if a germ (bacterial) or viral infection is present. Using this knowledge, an appropriate medicine which kills germs (antibiotic) can be chosen by your caregiver. A viral infection causing epididymitis will most often go away (resolve) without treatment. HOME CARE INSTRUCTIONS   Hot sitz baths for 20  minutes, 4 times per day, may help relieve pain.  Only take over-the-counter or prescription medicines for pain, discomfort or fever as directed by your caregiver.  Take all medicines, including antibiotics, as directed. Take the antibiotics for the full prescribed length of time even if you are feeling better.  It is very important to keep all follow-up appointments. SEEK IMMEDIATE MEDICAL CARE IF:   You have a fever.  You have pain not relieved with medicines.  You have any worsening of your problems.  Your pain seems to come and go.  You develop pain, redness, and swelling in the scrotum and surrounding areas. MAKE SURE YOU:   Understand these instructions.  Will watch your condition.  Will get help right away if you are not doing well or get worse. Document Released: 08/09/2000 Document Revised: 11/04/2011 Document Reviewed: 06/29/2009 [facility] Patient Information 2015 [place], [place]. This information is not intended to replace advice given to you by your health care provider. Make sure you discuss any questions you have with your health care provider.  Kidney Stones Kidney stones (urolithiasis) are deposits that form inside your kidneys. The intense pain is caused by the stone moving through the urinary tract. When the stone moves, the ureter goes into spasm around the stone. The stone is usually passed in the urine.  CAUSES   A disorder that makes certain neck glands produce too much parathyroid hormone (primary hyperparathyroidism).  A buildup of uric acid crystals, similar to gout in your joints.  Narrowing (stricture) of the ureter.  A kidney obstruction present at birth (congenital obstruction).  Previous surgery on the kidney or ureters.  Numerous kidney infections. SYMPTOMS  Feeling sick to your stomach (nauseous).  Throwing up (vomiting).  Blood in the urine (hematuria).  Pain that usually spreads (radiates) to the groin.  Frequency or urgency of  urination. DIAGNOSIS   Taking a history and physical exam.  Blood or urine tests.  CT scan.  Occasionally, an examination of the inside of the urinary bladder (cystoscopy) is performed. TREATMENT   Observation.  Increasing your fluid intake.  Extracorporeal shock wave lithotripsy--This is a noninvasive procedure that uses shock waves to break up kidney stones.  Surgery may be needed if you have severe pain or persistent obstruction. There are various surgical procedures. Most of the procedures are performed with the use of small instruments. Only small incisions are needed to accommodate these instruments, so recovery time is minimized. The size, location, and chemical composition are all important variables that will determine the proper choice of action for you. Talk to your health care provider to better understand your situation so that you will minimize the risk of injury to yourself and your kidney.  HOME CARE INSTRUCTIONS   Drink enough water and fluids to keep your urine clear or pale yellow. This will help you to pass the stone or stone fragments.  Strain all urine through the provided strainer. Keep all particulate matter and stones for your health care provider to see. The stone causing the pain may be as small as a grain of salt. It is very important to use the strainer each and every time you pass your urine. The collection of your stone will allow your health care provider to analyze it and verify that a stone has actually passed. The stone analysis will often identify what you can do to reduce the incidence of recurrences.  Only take over-the-counter or prescription medicines for pain, discomfort, or fever as directed by your health care provider.  Make a follow-up appointment with your health care provider as directed.  Get follow-up X-rays if required. The absence of pain does not always mean that the stone has passed. It may have only stopped moving. If the urine  remains completely obstructed, it can cause loss of kidney function or even complete destruction of the kidney. It is your responsibility to make sure X-rays and follow-ups are completed. Ultrasounds of the kidney can show blockages and the status of the kidney. Ultrasounds are not associated with any radiation and can be performed easily in a matter of minutes. SEEK MEDICAL CARE IF:  You experience pain that is progressive and unresponsive to any pain medicine you have been prescribed. SEEK IMMEDIATE MEDICAL CARE IF:   Pain cannot be controlled with the prescribed medicine.  You have a fever or shaking chills.  The severity or intensity of pain increases over 18 hours and is not relieved by pain medicine.  You develop a new onset of abdominal pain.  You feel faint or pass out.  You are unable to urinate. MAKE SURE YOU:   Understand these instructions.  Will watch your condition.  Will get help right away if you are not doing well or get worse. Document Released: 08/12/2005 Document Revised: 04/14/2013 Document Reviewed: 01/13/2013 [facility] Patient Information 2015 [place], [place]. This information is not intended to replace advice given to you by your health care provider. Make sure you discuss any questions you have with your health care provider.   Emergency Department Resource Guide 1) Find a Doctor and Pay Out of Pocket Although you won't have to find out who is covered by your  insurance plan, it is a good idea to ask around and get recommendations. You will then need to call the office and see if the doctor you have chosen will accept you as a new patient and what types of options they offer for patients who are self-pay. Some doctors offer discounts or will set up payment plans for their patients who do not have insurance, but you will need to ask so you aren't surprised when you get to your appointment.  2) Contact Your Local Health Department Not all health departments have  doctors that can see patients for sick visits, but many do, so it is worth a call to see if yours does. If you don't know where your local health department is, you can check in your phone book. The CDC also has a tool to help you locate your state's health department, and many state websites also have listings of all of their local health departments.  3) Find a Walk-in Clinic If your illness is not likely to be very severe or complicated, you may want to try a walk in clinic. These are popping up all over the country in pharmacies, drugstores, and shopping centers. They're usually staffed by nurse practitioners or physician assistants that have been trained to treat common illnesses and complaints. They're usually fairly quick and inexpensive. However, if you have serious medical issues or chronic medical problems, these are probably not your best option.  No Primary Care Doctor: - Call Health Connect at  [phone] - they can help you locate a primary care doctor that  accepts your insurance, provides certain services, etc. - Physician Referral Service- [phone]  Chronic Pain Problems: Organization         Address  Phone   Notes  [facility]  [phone] Patients need to be referred by their primary care doctor.   Medication Assistance: Organization         Address  Phone   Notes  [facility]ication [facility] [address] [place], [place] [address] [phone]) [phone] --Must be a resident of [facility] [place] -- Must have NO insurance coverage whatsoever (no Medicaid/ Medicare, etc.) -- The pt. MUST have a primary care doctor that directs their care regularly and follows them in the community   MedAssist  [phone]) [phone]   [employer]  [phone]    Agencies that provide inexpensive medical care: Organization         Address  Phone   Notes  [facility]  [phone]) [phone]   [facility]    [phone]) [phone]    [facility] [address] [place], [place] [address] [phone]) [phone]   [facility] [address], [place] [phone]) [phone]   [facility]anned Parenthood    [phone](336) [phone]   [facility]    [phone]) [phone]   [facility]  [address], [place] Phone:  [phone]) [phone], Fax:  [phone] Hours of Operation:  9 am - 6 pm, M-F.  Also accepts Medicaid/Medicare and self-pay.  [facility]  [address],  Phone: [phone]) [phone], Fax: [phone]. Hours of Operation:  8:30 am - 5:30 pm, M-F.  Also accepts Medicaid and self-pay.  [facility] [place] [address], [place] Phone: [phone]) [phone]   [facility] [address], [place], [place] [phone], Ext. [phone] Mondays &  Thursdays: 7-9 AM.  First 15 patients are seen on a first come, first serve basis.    Medicaid-accepting [facility] Providers:  Organization         Address  Phone   Notes  [facility] [address], [place] [phone] Also accepts self-pay patients.  [facility] [address] Laurell Josephs [place], [place]  [phone]   [facility] [address], [place] [phone]   [facility] [address] [place] [phone]   Renaye Rakers [address], [place]   [phone] Only accepts [place] patients after they have their name applied to their card.   Self-Pay (no insurance) in [facility]:  Organization         Address  Phone   Notes  Sickle Cell Patients, [facility] [address] [place], [place] [phone]   [facility] Care [address] [place], [place] [phone]   [facility]  [address], [place] [phone]   [facility]/Dr. Osei-Bonsu  [address] [place] or [address], [place] [phone] Phone number for both [place] and [place] locations is the same.  [facility] [address], [place] [phone]   [facility] [address], [place] or [address] [phone]   [facility] [address], [place] [phone], phone; [phone], fax Sees patients 1st and 3rd Saturday of every month.  Must not qualify for public or private insurance (i.e. Medicaid, Medicare, [place] Health Choice, Veterans' Benefits)  Household income should be no more than 200% of the poverty level The clinic cannot treat you if you are pregnant or think you are pregnant  Sexually transmitted diseases are not treated at the clinic.    Dental Care: Organization         Address  Phone  Notes  [facility] Department of [facility] [address] [place], [place] [phone] Accepts children up to age 29 who are enrolled in [place] or [place] Health Choice; pregnant women with a Medicaid card; and children who have applied for Medicaid or [place] Health Choice, but were declined, whose parents can pay a reduced fee at time of service.  [facility] Department of [facility]  [address], [place] [phone] Accepts children up to age 21 who are enrolled in [place] or [place] Health Choice; pregnant women with a Medicaid card; and children who have applied for Medicaid or [place] Health Choice, but were declined, whose parents can pay a reduced fee at time of service.  [facility]  [address] [place], [place] [phone] Patients are seen by appointment only. Walk-ins are not accepted. [facility] will see patients 22 years of age and older. Monday - Tuesday (8am-5pm) Most Wednesdays (8:30-5pm) $30 per visit, cash only  [facility]  Adult Dental Access PROGRAM  [address] [place] [address], [facility] [phone] Patients are seen by appointment only. Walk-ins are not accepted. [facility] will see patients 27 years of age and older. One Wednesday Evening (Monthly: Volunteer Based).  $30 per visit, cash only  [employer]  [phone] for adults; Children under  age 72, call Graduate Pediatric Dentistry at [phone]. Children aged 70-14, please call [phone] to request a pediatric application.  Dental services are provided in all areas of dental care including fillings, crowns and bridges, complete and partial dentures, implants, gum treatment, root canals, and extractions. Preventive care is also provided. Treatment is provided to both adults and children. Patients are selected via a lottery and there is often a waiting list.   [facility] [address] [place]  [phone] [email or web address]   [facility] [address] [place], [place] [phone], Ext. [phone] Second and Fourth Thursday of each month, opens at 6:30 AM; Clinic ends at 9 AM.  Patients are seen on a first-come first-served basis, and a limited number are seen during each clinic.   [facility]  [address] Ether [place]ins [place], [place] [phone]   Eligibility Requirements You must have lived in [place], [place], or [place] counties for at least the last three months.   You cannot be eligible for state or federal sponsored [place], including [employer], [place], or [employer].   You generally cannot be eligible for healthcare insurance through your employer.    How to apply: Eligibility screenings are held every Tuesday and Wednesday afternoon from 1:00 pm until 4:00 pm. You do not need an appointment for the interview!  [facility] [address], [place], [place] [phone]   [facility] Health Department  [phone]   [facility] Health Department  [phone]   [facility] Health Department  [phone]    Behavioral Health Resources in the Community: Intensive Outpatient Programs Organization         Address  Phone  Notes  [facility] [address], [place], [place] [phone]   [facility] Outpatient [address] [place], [place] [phone]   [facility] [address] [place], [place]  [phone]   [facility] [address],  [place], [place] [phone] or [phone]   Substance Abuse Resources Organization         Address  Phone  Notes  [facility] Drug Services  [phone]   [facility]  [phone]   [facility] [place]  [phone]   Floydene Flock  [phone]   Residential & Outpatient Substance Abuse Program  [phone]   Psychological Services Organization         Address  Phone  Notes  [facility] Health  [phone]   [facility]  [phone]   [facility] Mental Health [address], [place] [phone] or [phone]    Mobile Crisis Teams Organization         Address  Phone  Notes  [facility], Mobile Crisis Care Unit  [phone]   [facility]  [address] [place], [place] [phone]   Doristine Locks [address] [place] [phone]    Self-Help/Support Groups Organization         Address  Phone             Notes  [facility] [place] - variety of support groups  336- I7437963 Call for more information  Narcotics Anonymous (NA), [facility] [address], [employer] [place]  2 meetings at this location   Chief Executive Officer  Notes  [facility] [phone] [address],    [place]  [phone]   [facility]  [address], [place] [phone], [place], [place] [phone]   [facility]  [facility]ility [address], [place] [phone] Admissions: 8am-3pm M-F  [facility] [address]    [place], [place] [phone]   [facility] [address], [place], [place] [phone]   [facility] [address]  [place], [place] [phone]   [facility] - Intensive Outpatient [address], [place], [place] [phone]   [facility] ([facility] [address]  [place], [place] [phone] or [phone]   [facility] ([facility]) [address] [place], [place] [phone] Accepts Medicaid  [facility] [address]  [place] [phone] Substance Abuse/Addiction Treatment   [facility] Behavioral Health Resources Organization         Address  Phone  Notes  [facility]  [phone](888) [phone]   Angie FavaJulie Brannon, PhD [address] [place], [place]   [phone]) [phone] or [phone](336) [phone]   [facility]   [address] [place], [place] [phone](336) [phone]   [facility] [address], [place], [place] [phone](336) [phone] Insurance/Medicaid/sponsorship through [facility]  [facility] Families [address]                                    [place], [place] [phone](336) [phone] Therapy/tele-psych/case  [facility] [address].   [place], [place] [phone](336) [phone]    Dr. Lolly MustacheArfeen  [phone]) [phone]   [facility]  [employer] [facility] 1) [address], [place] 2) [address], [place] 3)  [address], [place] [phone](336) [phone](336) [phone]  [phone](336) [phone]   [facility] Child Abuse Hotline [phone](336) [phone] or [phone](336) [phone] (After Hours)

## 2014-02-13 NOTE — ED Notes (Signed)
EDP and PA aware of patients increased heart rate.

## 2014-02-15 NOTE — ED Provider Notes (Signed)
Medical screening examination/treatment/procedure(s) were performed by non-physician practitioner and as supervising physician I was immediately available for consultation/collaboration.   EKG Interpretation None        Gwyneth SproutWhitney Plunkett, MD 02/15/14 [phone]

## 2014-06-04 ENCOUNTER — Emergency Department (HOSPITAL_COMMUNITY)
Admission: EM | Admit: 2014-06-04 | Discharge: 2014-06-04 | Disposition: A | Discharge status: Home / Self Care | Payer: Self-pay | Attending: Emergency Medicine | Admitting: Emergency Medicine

## 2014-06-04 ENCOUNTER — Encounter (HOSPITAL_COMMUNITY): Payer: Self-pay | Admitting: Emergency Medicine

## 2014-06-04 DIAGNOSIS — K006 Disturbances in tooth eruption: Secondary | ICD-10-CM | POA: Insufficient documentation

## 2014-06-04 DIAGNOSIS — Z720 Tobacco use: Secondary | ICD-10-CM | POA: Insufficient documentation

## 2014-06-04 DIAGNOSIS — K047 Periapical abscess without sinus: Secondary | ICD-10-CM

## 2014-06-04 DIAGNOSIS — K029 Dental caries, unspecified: Secondary | ICD-10-CM | POA: Insufficient documentation

## 2014-06-04 DIAGNOSIS — K044 Acute apical periodontitis of pulpal origin: Secondary | ICD-10-CM | POA: Insufficient documentation

## 2014-06-04 MED ORDER — HYDROCODONE-ACETAMINOPHEN 5-325 MG PO TABS: 1.0000 | ORAL_TABLET | ORAL | Status: DC | PRN

## 2014-06-04 MED ORDER — AMOXICILLIN 500 MG PO CAPS: 500.0000 mg | ORAL_CAPSULE | Freq: Three times a day (TID) | ORAL | Status: DC

## 2014-06-04 NOTE — Discharge Instructions (Signed)

## 2014-06-04 NOTE — ED Provider Notes (Signed)
CSN: [id number]     Arrival date & time 06/04/14  0621 History  This chart was scribed for Marlon Peliffany Mekaylah Klich, PA-C, working with Vanetta MuldersScott Zackowski, MD found by Elon SpannerGarrett Cook, ED Scribe. This patient was seen in room TR08C/TR08C and the patient's care was started at 9:08 AM.  Chief Complaint  Patient presents with  . Abscess    The history is provided by the patient. No language interpreter was used.   HPI Comments: Eddie Joseph is a [age] y.o. male who presents to the Emergency Department complaining of a gradually worsening painful area of swelling onset 3 days ago in his right lower dentition.  The patient reports the swelling was the first symptom to appear.  Patient denies taking any medications for the complaint.  Patient denies gargling with salt water.  Patient denies fever, vomiting, difficulty swallowing, CP.  NKA.     onHistory reviewed. No pertinent past medical history. History reviewed. No pertinent past surgical history. No family history on file. History  Substance Use Topics  . Smoking status: Current Every Day Smoker    Types: Cigarettes  . Smokeless tobacco: Not on file  . Alcohol Use: Yes    Review of Systems  HENT: Positive for dental problem.   All other systems reviewed and are negative.     Allergies  Review of patient's allergies indicates no known allergies.  Home Medications   Prior to Admission medications   Medication Sig Start Date End Date Taking? Authorizing Provider  ibuprofen (ADVIL,MOTRIN) 200 MG tablet Take 200-400 mg by mouth every 6 (six) hours as needed.   Yes Historical Provider, MD  amoxicillin (AMOXIL) 500 MG capsule Take 1 capsule (500 mg total) by mouth 3 (three) times daily. 06/04/14   Dorthula Matasiffany G Shaquavia Whisonant, PA-C  HYDROcodone-acetaminophen (NORCO/VICODIN) 5-325 MG per tablet Take 1-2 tablets by mouth every 4 (four) hours as needed. 06/04/14   Dorthula Matasiffany G Willer Osorno, PA-C   BP 120/57  Pulse 97  Temp(Src) 98.7 F (37.1 C) (Oral)  Resp 20  Ht 5'  10" (1.778 m)  Wt 145 lb (65.772 kg)  BMI 20.81 kg/m2  SpO2 99% Physical Exam  Nursing note and vitals reviewed. Constitutional: He appears well-developed and well-nourished.  HENT:  Head: Normocephalic and atraumatic.  Mouth/Throat: Oropharynx is clear and moist. No oral lesions. No trismus in the jaw. Abnormal dentition. Dental caries present. No dental abscesses or lacerations.    No sublingual swelling No obvious abscess on exam Widespread dental decay  Eyes: Conjunctivae and EOM are normal. Pupils are equal, round, and reactive to light.  Neck: Normal range of motion. Neck supple.  Cardiovascular: Normal rate and regular rhythm.   Pulmonary/Chest: Effort normal and breath sounds normal.    ED Course  Procedures (including critical care time)  DIAGNOSTIC STUDIES: Oxygen Saturation is 99% on RA, normal by my interpretation.    COORDINATION OF CARE:  9:11 AM Discussed plan to order pain medication and antibiotics.  Patient offered but denied having a dental block. Advised patient to follow-up with dentist.    Labs Review Labs Reviewed - No data to display  Imaging Review No results found.   EKG Interpretation None      MDM   Final diagnoses:  Dental infection    amoxicillin (AMOXIL) 500 MG capsule Take 1 capsule (500 mg total) by mouth 3 (three) times daily. 21 capsule Dorthula Matasiffany G Shakiya Mcneary, PA-C   HYDROcodone-acetaminophen (NORCO/VICODIN) 5-325 MG per tablet Take 1-2 tablets by mouth every 4 (four)  hours as needed. 20 tablet Dorthula Matasiffany G Kasyn Stouffer, PA-C  Patient has dental pain. No emergent s/sx's present. Patent airway. No trismus or airway compromise Will be given pain medication and antibiotics. I discussed the need to call dentist within 24/48 hours for follow-up. Dental referral given. Return to ED precautions given.  Pt voiced understanding and has agreed to follow-up.   58 y.o.Eddie Joseph's evaluation in the Emergency Department is complete. It has been  determined that no acute conditions requiring further emergency intervention are present at this time. The patient/guardian have been advised of the diagnosis and plan. We have discussed signs and symptoms that warrant return to the ED, such as changes or worsening in symptoms.  Vital signs are stable at discharge. Filed Vitals:   06/04/14 0626  BP: 120/57  Pulse: 97  Temp: 98.7 F (37.1 C)  Resp: 20    Patient/guardian has voiced understanding and agreed to follow-up with the PCP or specialist.   Dorthula Matasiffany G Giles Currie, PA-C 06/04/14 [phone]

## 2014-06-04 NOTE — ED Notes (Signed)
Pt. reports abscess at lower chin onset 2 days ago with no drainage , denies fever or chills.

## 2014-06-04 NOTE — ED Notes (Signed)
Declined W/C at D/C and was escorted to lobby by RN. 

## 2014-06-04 NOTE — ED Provider Notes (Signed)
Medical screening examination/treatment/procedure(s) were performed by non-physician practitioner and as supervising physician I was immediately available for consultation/collaboration.   EKG Interpretation None        Shabreka Coulon, MD 06/04/14 0934 

## 2020-07-02 ENCOUNTER — Encounter (HOSPITAL_COMMUNITY): Payer: Self-pay

## 2020-07-02 ENCOUNTER — Emergency Department (HOSPITAL_COMMUNITY): Payer: Self-pay

## 2020-07-02 ENCOUNTER — Other Ambulatory Visit: Payer: Self-pay

## 2020-07-02 ENCOUNTER — Inpatient Hospital Stay (HOSPITAL_COMMUNITY)
Admission: EM | Disposition: A | Discharge status: Home / Self Care | Payer: Self-pay | Source: Home / Self Care | Attending: Cardiovascular Disease

## 2020-07-02 ENCOUNTER — Inpatient Hospital Stay (HOSPITAL_COMMUNITY): Payer: Self-pay

## 2020-07-02 ENCOUNTER — Inpatient Hospital Stay (HOSPITAL_COMMUNITY)
Admission: EM | Admit: 2020-07-02 | Discharge: 2020-07-04 | DRG: 246 | Disposition: A | Discharge status: Home / Self Care | Payer: Self-pay | Attending: Cardiovascular Disease | Admitting: Cardiovascular Disease

## 2020-07-02 DIAGNOSIS — I4891 Unspecified atrial fibrillation: Secondary | ICD-10-CM | POA: Diagnosis present

## 2020-07-02 DIAGNOSIS — R570 Cardiogenic shock: Secondary | ICD-10-CM | POA: Diagnosis present

## 2020-07-02 DIAGNOSIS — Z720 Tobacco use: Secondary | ICD-10-CM

## 2020-07-02 DIAGNOSIS — R079 Chest pain, unspecified: Secondary | ICD-10-CM

## 2020-07-02 DIAGNOSIS — R001 Bradycardia, unspecified: Secondary | ICD-10-CM | POA: Diagnosis present

## 2020-07-02 DIAGNOSIS — F17210 Nicotine dependence, cigarettes, uncomplicated: Secondary | ICD-10-CM | POA: Diagnosis present

## 2020-07-02 DIAGNOSIS — Z20822 Contact with and (suspected) exposure to covid-19: Secondary | ICD-10-CM | POA: Diagnosis present

## 2020-07-02 DIAGNOSIS — I2111 ST elevation (STEMI) myocardial infarction involving right coronary artery: Principal | ICD-10-CM | POA: Diagnosis present

## 2020-07-02 DIAGNOSIS — N289 Disorder of kidney and ureter, unspecified: Secondary | ICD-10-CM | POA: Diagnosis present

## 2020-07-02 DIAGNOSIS — Z955 Presence of coronary angioplasty implant and graft: Secondary | ICD-10-CM

## 2020-07-02 DIAGNOSIS — E782 Mixed hyperlipidemia: Secondary | ICD-10-CM | POA: Diagnosis present

## 2020-07-02 DIAGNOSIS — I213 ST elevation (STEMI) myocardial infarction of unspecified site: Secondary | ICD-10-CM | POA: Diagnosis present

## 2020-07-02 DIAGNOSIS — I2510 Atherosclerotic heart disease of native coronary artery without angina pectoris: Secondary | ICD-10-CM | POA: Diagnosis present

## 2020-07-02 HISTORY — PX: LEFT HEART CATH AND CORONARY ANGIOGRAPHY: CATH118249

## 2020-07-02 HISTORY — DX: Atherosclerotic heart disease of native coronary artery without angina pectoris: I25.10

## 2020-07-02 HISTORY — PX: CORONARY/GRAFT ACUTE MI REVASCULARIZATION: CATH118305

## 2020-07-02 HISTORY — DX: Acute myocardial infarction, unspecified: I21.9

## 2020-07-02 HISTORY — PX: CORONARY STENT INTERVENTION: CATH118234

## 2020-07-02 LAB — CBC WITH DIFFERENTIAL/PLATELET
Abs Immature Granulocytes: 0.09 10*3/uL — ABNORMAL HIGH (ref 0.00–0.07)
Basophils Absolute: 0.1 10*3/uL (ref 0.0–0.1)
Basophils Relative: 1 %
Eosinophils Absolute: 0.5 10*3/uL (ref 0.0–0.5)
Eosinophils Relative: 4 %
HCT: 47.9 % (ref 39.0–52.0)
Hemoglobin: 15.6 g/dL (ref 13.0–17.0)
Immature Granulocytes: 1 %
Lymphocytes Relative: 26 %
Lymphs Abs: 3 10*3/uL (ref 0.7–4.0)
MCH: 33.1 pg (ref 26.0–34.0)
MCHC: 32.6 g/dL (ref 30.0–36.0)
MCV: 101.7 fL — ABNORMAL HIGH (ref 80.0–100.0)
Monocytes Absolute: 0.9 10*3/uL (ref 0.1–1.0)
Monocytes Relative: 8 %
Neutro Abs: 7.3 10*3/uL (ref 1.7–7.7)
Neutrophils Relative %: 60 %
Platelets: 216 10*3/uL (ref 150–400)
RBC: 4.71 MIL/uL (ref 4.22–5.81)
RDW: 13.3 % (ref 11.5–15.5)
WBC: 11.8 10*3/uL — ABNORMAL HIGH (ref 4.0–10.5)
nRBC: 0 % (ref 0.0–0.2)

## 2020-07-02 LAB — CBC
HCT: 41.9 % (ref 39.0–52.0)
Hemoglobin: 14.3 g/dL (ref 13.0–17.0)
MCH: 33.6 pg (ref 26.0–34.0)
MCHC: 34.1 g/dL (ref 30.0–36.0)
MCV: 98.4 fL (ref 80.0–100.0)
Platelets: 217 10*3/uL (ref 150–400)
RBC: 4.26 MIL/uL (ref 4.22–5.81)
RDW: 13.5 % (ref 11.5–15.5)
WBC: 11.7 10*3/uL — ABNORMAL HIGH (ref 4.0–10.5)
nRBC: 0 % (ref 0.0–0.2)

## 2020-07-02 LAB — PROTIME-INR
INR: 1 (ref 0.8–1.2)
Prothrombin Time: 12.9 seconds (ref 11.4–15.2)

## 2020-07-02 LAB — RESPIRATORY PANEL BY RT PCR (FLU A&B, COVID)
Influenza A by PCR: NEGATIVE
Influenza B by PCR: NEGATIVE
SARS Coronavirus 2 by RT PCR: NEGATIVE

## 2020-07-02 LAB — ECHOCARDIOGRAM COMPLETE
AR max vel: 1.77 cm2
AV Area VTI: 1.96 cm2
AV Area mean vel: 1.55 cm2
AV Mean grad: 2.5 mmHg
AV Peak grad: 6.1 mmHg
Ao pk vel: 1.23 m/s
Area-P 1/2: 4.06 cm2
Calc EF: 44.6 %
Height: 70 in
S' Lateral: 3.3 cm
Single Plane A2C EF: 52 %
Single Plane A4C EF: 38.3 %
Weight: 2400 oz

## 2020-07-02 LAB — MRSA PCR SCREENING: MRSA by PCR: NEGATIVE

## 2020-07-02 LAB — TROPONIN I (HIGH SENSITIVITY)
Troponin I (High Sensitivity): 446 ng/L (ref <18)
Troponin I (High Sensitivity): 25012 ng/L (ref <18)

## 2020-07-02 LAB — COMPREHENSIVE METABOLIC PANEL
ALT: 21 U/L (ref 0–44)
AST: 29 U/L (ref 15–41)
Albumin: 3.2 g/dL — ABNORMAL LOW (ref 3.5–5.0)
Alkaline Phosphatase: 95 U/L (ref 38–126)
Anion gap: 13 (ref 5–15)
BUN: 12 mg/dL (ref 8–23)
CO2: 20 mmol/L — ABNORMAL LOW (ref 22–32)
Calcium: 8.6 mg/dL — ABNORMAL LOW (ref 8.9–10.3)
Chloride: 106 mmol/L (ref 98–111)
Creatinine, Ser: 1.38 mg/dL — ABNORMAL HIGH (ref 0.61–1.24)
GFR, Estimated: 57 mL/min — ABNORMAL LOW (ref >60)
Glucose, Bld: 145 mg/dL — ABNORMAL HIGH (ref 70–99)
Potassium: 4.1 mmol/L (ref 3.5–5.1)
Sodium: 139 mmol/L (ref 135–145)
Total Bilirubin: 0.6 mg/dL (ref 0.3–1.2)
Total Protein: 5.8 g/dL — ABNORMAL LOW (ref 6.5–8.1)

## 2020-07-02 LAB — LIPID PANEL
Cholesterol: 259 mg/dL — ABNORMAL HIGH (ref 0–200)
HDL: 32 mg/dL — ABNORMAL LOW (ref >40)
LDL Cholesterol: 177 mg/dL — ABNORMAL HIGH (ref 0–99)
Total CHOL/HDL Ratio: 8.1 RATIO
Triglycerides: 248 mg/dL — ABNORMAL HIGH (ref <150)
VLDL: 50 mg/dL — ABNORMAL HIGH (ref 0–40)

## 2020-07-02 LAB — CREATININE, SERUM
Creatinine, Ser: 1.19 mg/dL (ref 0.61–1.24)
GFR, Estimated: >60 mL/min (ref >60)

## 2020-07-02 LAB — POCT I-STAT, CHEM 8
BUN: 13 mg/dL (ref 8–23)
Calcium, Ion: 1.03 mmol/L — ABNORMAL LOW (ref 1.15–1.40)
Chloride: 109 mmol/L (ref 98–111)
Creatinine, Ser: 1.2 mg/dL (ref 0.61–1.24)
Glucose, Bld: 141 mg/dL — ABNORMAL HIGH (ref 70–99)
HCT: 44 % (ref 39.0–52.0)
Hemoglobin: 15 g/dL (ref 13.0–17.0)
Potassium: 3.9 mmol/L (ref 3.5–5.1)
Sodium: 139 mmol/L (ref 135–145)
TCO2: 20 mmol/L — ABNORMAL LOW (ref 22–32)

## 2020-07-02 LAB — HIV ANTIBODY (ROUTINE TESTING W REFLEX): HIV Screen 4th Generation wRfx: NONREACTIVE

## 2020-07-02 LAB — HEMOGLOBIN A1C
Hgb A1c MFr Bld: 5.4 % (ref 4.8–5.6)
Mean Plasma Glucose: 108.28 mg/dL

## 2020-07-02 LAB — APTT: aPTT: 22 seconds — ABNORMAL LOW (ref 24–36)

## 2020-07-02 SURGERY — CORONARY/GRAFT ACUTE MI REVASCULARIZATION
Anesthesia: LOCAL

## 2020-07-02 MED ORDER — NOREPINEPHRINE BITARTRATE 1 MG/ML IV SOLN
INTRAVENOUS | Status: AC | PRN
  Administered 2020-07-02: 5 ug/min via INTRAVENOUS

## 2020-07-02 MED ORDER — SODIUM CHLORIDE 0.9 % WEIGHT BASED INFUSION
1.0000 mL/kg/h | INTRAVENOUS | Status: AC
  Administered 2020-07-02: 1 mL/kg/h via INTRAVENOUS

## 2020-07-02 MED ORDER — HEPARIN SODIUM (PORCINE) 5000 UNIT/ML IJ SOLN: 4000.0000 [IU] | Freq: Once | INTRAMUSCULAR | Status: DC

## 2020-07-02 MED ORDER — ACETAMINOPHEN 325 MG PO TABS
650.0000 mg | ORAL_TABLET | ORAL | Status: DC | PRN
  Administered 2020-07-02: 650 mg via ORAL
  Filled 2020-07-02: qty 2

## 2020-07-02 MED ORDER — NITROGLYCERIN 1 MG/10 ML FOR IR/CATH LAB
INTRA_ARTERIAL | Status: AC
  Filled 2020-07-02: qty 10

## 2020-07-02 MED ORDER — TIROFIBAN HCL IN NACL 5-0.9 MG/100ML-% IV SOLN
INTRAVENOUS | Status: AC | PRN
  Administered 2020-07-02: 0.15 ug/kg/min via INTRAVENOUS

## 2020-07-02 MED ORDER — ONDANSETRON HCL 4 MG/2ML IJ SOLN
INTRAMUSCULAR | Status: DC | PRN
  Administered 2020-07-02: 4 mg via INTRAVENOUS

## 2020-07-02 MED ORDER — SODIUM CHLORIDE 0.9% FLUSH
3.0000 mL | Freq: Two times a day (BID) | INTRAVENOUS | Status: DC
  Administered 2020-07-02 – 2020-07-04 (×4): 3 mL via INTRAVENOUS

## 2020-07-02 MED ORDER — FENTANYL CITRATE (PF) 100 MCG/2ML IJ SOLN
INTRAMUSCULAR | Status: AC
Start: 2020-07-02 — End: ?
  Filled 2020-07-02: qty 2

## 2020-07-02 MED ORDER — TICAGRELOR 90 MG PO TABS
90.0000 mg | ORAL_TABLET | Freq: Two times a day (BID) | ORAL | Status: DC
  Administered 2020-07-02 – 2020-07-04 (×4): 90 mg via ORAL
  Filled 2020-07-02 (×4): qty 1

## 2020-07-02 MED ORDER — SODIUM CHLORIDE 0.9 % IV SOLN: INTRAVENOUS | Status: DC

## 2020-07-02 MED ORDER — TICAGRELOR 90 MG PO TABS
ORAL_TABLET | ORAL | Status: AC
  Filled 2020-07-02: qty 2

## 2020-07-02 MED ORDER — AMIODARONE HCL IN DEXTROSE 360-4.14 MG/200ML-% IV SOLN
INTRAVENOUS | Status: AC | PRN
  Administered 2020-07-02: 59.94 mg/h via INTRAVENOUS

## 2020-07-02 MED ORDER — HEPARIN SODIUM (PORCINE) 1000 UNIT/ML IJ SOLN
INTRAMUSCULAR | Status: DC | PRN
  Administered 2020-07-02: 5000 [IU] via INTRAVENOUS

## 2020-07-02 MED ORDER — SODIUM CHLORIDE 0.9 % IV SOLN: 250.0000 mL | INTRAVENOUS | Status: DC | PRN

## 2020-07-02 MED ORDER — DOPAMINE-DEXTROSE 3.2-5 MG/ML-% IV SOLN
INTRAVENOUS | Status: DC | PRN
  Administered 2020-07-02: 20 ug/kg/min via INTRAVENOUS

## 2020-07-02 MED ORDER — AMIODARONE HCL IN DEXTROSE 360-4.14 MG/200ML-% IV SOLN
60.0000 mg/h | INTRAVENOUS | Status: AC
  Administered 2020-07-02: 60 mg/h via INTRAVENOUS

## 2020-07-02 MED ORDER — HEPARIN (PORCINE) IN NACL 1000-0.9 UT/500ML-% IV SOLN
INTRAVENOUS | Status: DC | PRN
  Administered 2020-07-02 (×2): 500 mL

## 2020-07-02 MED ORDER — NOREPINEPHRINE 4 MG/250ML-% IV SOLN
INTRAVENOUS | Status: AC
  Filled 2020-07-02: qty 250

## 2020-07-02 MED ORDER — NITROGLYCERIN 1 MG/10 ML FOR IR/CATH LAB
INTRA_ARTERIAL | Status: DC | PRN
  Administered 2020-07-02: 100 ug via INTRACORONARY

## 2020-07-02 MED ORDER — AMIODARONE HCL IN DEXTROSE 360-4.14 MG/200ML-% IV SOLN
INTRAVENOUS | Status: AC
  Filled 2020-07-02: qty 200

## 2020-07-02 MED ORDER — ONDANSETRON HCL 4 MG/2ML IJ SOLN: 4.0000 mg | Freq: Four times a day (QID) | INTRAMUSCULAR | Status: DC | PRN

## 2020-07-02 MED ORDER — TIROFIBAN (AGGRASTAT) BOLUS VIA INFUSION
INTRAVENOUS | Status: DC | PRN
  Administered 2020-07-02: 1700 ug via INTRAVENOUS

## 2020-07-02 MED ORDER — FENTANYL CITRATE (PF) 100 MCG/2ML IJ SOLN
INTRAMUSCULAR | Status: DC | PRN
  Administered 2020-07-02: 25 ug via INTRAVENOUS

## 2020-07-02 MED ORDER — NITROGLYCERIN 0.4 MG SL SUBL: 0.4000 mg | SUBLINGUAL_TABLET | SUBLINGUAL | Status: DC | PRN

## 2020-07-02 MED ORDER — HEPARIN (PORCINE) IN NACL 1000-0.9 UT/500ML-% IV SOLN
INTRAVENOUS | Status: AC
  Filled 2020-07-02: qty 1000

## 2020-07-02 MED ORDER — CEFAZOLIN SODIUM-DEXTROSE 2-3 GM-%(50ML) IV SOLR
INTRAVENOUS | Status: AC | PRN
  Administered 2020-07-02: 2 g via INTRAVENOUS

## 2020-07-02 MED ORDER — DOPAMINE-DEXTROSE 3.2-5 MG/ML-% IV SOLN
INTRAVENOUS | Status: AC
  Filled 2020-07-02: qty 250

## 2020-07-02 MED ORDER — ASPIRIN EC 81 MG PO TBEC
81.0000 mg | DELAYED_RELEASE_TABLET | Freq: Every day | ORAL | Status: DC
  Administered 2020-07-03 – 2020-07-04 (×2): 81 mg via ORAL
  Filled 2020-07-02 (×2): qty 1

## 2020-07-02 MED ORDER — TIROFIBAN HCL IV 12.5 MG/250 ML
0.0750 ug/kg/min | INTRAVENOUS | Status: AC
  Administered 2020-07-02: 0.075 ug/kg/min via INTRAVENOUS
  Filled 2020-07-02: qty 250

## 2020-07-02 MED ORDER — MIDAZOLAM HCL 2 MG/2ML IJ SOLN
INTRAMUSCULAR | Status: AC
  Filled 2020-07-02: qty 2

## 2020-07-02 MED ORDER — SODIUM CHLORIDE 0.9% FLUSH: 3.0000 mL | INTRAVENOUS | Status: DC | PRN

## 2020-07-02 MED ORDER — PERFLUTREN LIPID MICROSPHERE
1.0000 mL | INTRAVENOUS | Status: AC | PRN
  Administered 2020-07-02: 2 mL via INTRAVENOUS
  Filled 2020-07-02: qty 10

## 2020-07-02 MED ORDER — SODIUM CHLORIDE 0.9 % IV SOLN
250.0000 mL | INTRAVENOUS | Status: DC
  Administered 2020-07-02: 250 mL via INTRAVENOUS

## 2020-07-02 MED ORDER — IOHEXOL 350 MG/ML SOLN
INTRAVENOUS | Status: AC
  Filled 2020-07-02: qty 1

## 2020-07-02 MED ORDER — SODIUM CHLORIDE 0.9 % IV SOLN
INTRAVENOUS | Status: AC | PRN
  Administered 2020-07-02: 150 mL/h via INTRAVENOUS

## 2020-07-02 MED ORDER — ATORVASTATIN CALCIUM 80 MG PO TABS
80.0000 mg | ORAL_TABLET | Freq: Every day | ORAL | Status: DC
  Administered 2020-07-02 – 2020-07-04 (×3): 80 mg via ORAL
  Filled 2020-07-02 (×3): qty 1

## 2020-07-02 MED ORDER — HYDRALAZINE HCL 20 MG/ML IJ SOLN: 10.0000 mg | INTRAMUSCULAR | Status: AC | PRN

## 2020-07-02 MED ORDER — TIROFIBAN HCL IN NACL 5-0.9 MG/100ML-% IV SOLN
INTRAVENOUS | Status: AC
  Filled 2020-07-02: qty 100

## 2020-07-02 MED ORDER — LABETALOL HCL 5 MG/ML IV SOLN: 10.0000 mg | INTRAVENOUS | Status: AC | PRN

## 2020-07-02 MED ORDER — HEPARIN SODIUM (PORCINE) 1000 UNIT/ML IJ SOLN
INTRAMUSCULAR | Status: AC
  Filled 2020-07-02: qty 1

## 2020-07-02 MED ORDER — NOREPINEPHRINE 4 MG/250ML-% IV SOLN: 0.0000 ug/min | INTRAVENOUS | Status: DC

## 2020-07-02 MED ORDER — AMIODARONE HCL IN DEXTROSE 360-4.14 MG/200ML-% IV SOLN
30.0000 mg/h | INTRAVENOUS | Status: DC
  Administered 2020-07-02: 30 mg/h via INTRAVENOUS
  Filled 2020-07-02 (×3): qty 200

## 2020-07-02 MED ORDER — HEPARIN SODIUM (PORCINE) 5000 UNIT/ML IJ SOLN
5000.0000 [IU] | Freq: Three times a day (TID) | INTRAMUSCULAR | Status: DC
  Administered 2020-07-02 – 2020-07-04 (×5): 5000 [IU] via SUBCUTANEOUS
  Filled 2020-07-02 (×5): qty 1

## 2020-07-02 MED ORDER — IOHEXOL 350 MG/ML SOLN
INTRAVENOUS | Status: DC | PRN
  Administered 2020-07-02: 120 mL

## 2020-07-02 MED ORDER — LIDOCAINE HCL (PF) 1 % IJ SOLN
INTRAMUSCULAR | Status: AC
  Filled 2020-07-02: qty 30

## 2020-07-02 MED ORDER — NOREPINEPHRINE 4 MG/250ML-% IV SOLN
2.0000 ug/min | INTRAVENOUS | Status: DC
  Administered 2020-07-02: 5 ug/min via INTRAVENOUS

## 2020-07-02 MED ORDER — AMIODARONE LOAD VIA INFUSION
INTRAVENOUS | Status: DC | PRN
  Administered 2020-07-02: 150 mg via INTRAVENOUS

## 2020-07-02 MED ORDER — TICAGRELOR 90 MG PO TABS
ORAL_TABLET | ORAL | Status: DC | PRN
  Administered 2020-07-02: 180 mg via ORAL

## 2020-07-02 MED ORDER — MIDAZOLAM HCL 2 MG/2ML IJ SOLN
INTRAMUSCULAR | Status: DC | PRN
  Administered 2020-07-02 (×2): 1 mg via INTRAVENOUS

## 2020-07-02 MED ORDER — CHLORHEXIDINE GLUCONATE CLOTH 2 % EX PADS
6.0000 | MEDICATED_PAD | Freq: Every day | CUTANEOUS | Status: DC
  Administered 2020-07-02 – 2020-07-03 (×2): 6 via TOPICAL

## 2020-07-02 MED ORDER — LIDOCAINE HCL (PF) 1 % IJ SOLN
INTRAMUSCULAR | Status: DC | PRN
  Administered 2020-07-02: 10 mL

## 2020-07-02 MED ORDER — CEFAZOLIN SODIUM-DEXTROSE 2-4 GM/100ML-% IV SOLN
INTRAVENOUS | Status: AC
  Filled 2020-07-02: qty 100

## 2020-07-02 SURGICAL SUPPLY — 22 items
BALLN SAPPHIRE 2.5X12 (BALLOONS) ×2
BALLN SAPPHIRE ~~LOC~~ 3.75X18 (BALLOONS) ×1 IMPLANT
BALLOON SAPPHIRE 2.5X12 (BALLOONS) IMPLANT
CATH 5FR JL3.5 JR4 ANG PIG MP (CATHETERS) ×1 IMPLANT
CATH INFINITI 5FR JL4 (CATHETERS) ×1 IMPLANT
CATH LAUNCHER 6FR JR4 (CATHETERS) ×1 IMPLANT
CLOSURE PERCLOSE PROSTYLE (VASCULAR PRODUCTS) ×2 IMPLANT
GLIDESHEATH SLEND SS 6F .021 (SHEATH) ×1 IMPLANT
GUIDEWIRE INQWIRE 1.5J.035X260 (WIRE) IMPLANT
INQWIRE 1.5J .035X260CM (WIRE) ×2
KIT ENCORE 26 ADVANTAGE (KITS) ×1 IMPLANT
KIT HEART LEFT (KITS) ×2 IMPLANT
KIT MICROPUNCTURE NIT STIFF (SHEATH) ×1 IMPLANT
PACK CARDIAC CATHETERIZATION (CUSTOM PROCEDURE TRAY) ×2 IMPLANT
SHEATH PINNACLE 6F 10CM (SHEATH) ×1 IMPLANT
SHEATH PINNACLE 8F 10CM (SHEATH) ×1 IMPLANT
SHEATH PROBE COVER 6X72 (BAG) ×1 IMPLANT
STENT RESOLUTE ONYX 3.5X38 (Permanent Stent) ×1 IMPLANT
TRANSDUCER W/STOPCOCK (MISCELLANEOUS) ×2 IMPLANT
TUBING CIL FLEX 10 FLL-RA (TUBING) ×2 IMPLANT
WIRE COUGAR XT STRL 190CM (WIRE) ×1 IMPLANT
WIRE EMERALD 3MM-J .035X150CM (WIRE) ×1 IMPLANT

## 2020-07-02 NOTE — Progress Notes (Signed)
ANTICOAGULATION CONSULT NOTE - Initial Consult  Pharmacy Consult for tirofiban (Aggrastat) Indication: STEMI  No Known Allergies  Patient Measurements: Height: 5\' 10"  (177.8 cm) Weight: 68 kg (150 lb) IBW/kg (Calculated) : 73  Vital Signs: Temp Source: Oral (11/07 0649) BP: 92/61 (11/07 0834) Pulse Rate: 92 (11/07 0900)  Labs: Recent Labs    07/02/20 0652 07/02/20 0710  HGB 15.6 15.0  HCT 47.9 44.0  PLT 216  --   APTT 22*  --   LABPROT 12.9  --   INR 1.0  --   CREATININE 1.38* 1.20  TROPONINIHS 446*  --     Estimated Creatinine Clearance: 60.6 mL/min (by C-G formula based on SCr of 1.2 mg/dL).   Medical History: History reviewed. No pertinent past medical history.  Assessment: 64 yo M presents with STEMI now s/p PCI with DES to RCA. Patient given 1700 mcg (25 mcg/kg) bolus dose of tirofiban during cath and started on 0.15 mcg/kg/min infusion at 0740 this AM. Pharmacy asked to continue tirofiban for 6 hours.   Given patient's borderline CrCl 60 ml/min and weight of 68 kg, will adjust continuous infusion dose to 0.075 mcg/kg/min.  Goal of Therapy:  Monitor platelets by anticoagulation protocol: Yes   Plan:  Adjust tirofiban infusion to 0.075 mcg/kg/min for total 6 hours Monitor for s/sx bleeding  65, PharmD PGY2 Cardiology Pharmacy Resident Phone: [phone] 07/02/2020  9:39 AM  Please check [email or web address] for unit-specific pharmacy phone numbers.

## 2020-07-02 NOTE — ED Notes (Signed)
ED Provider at bedside. 

## 2020-07-02 NOTE — ED Notes (Signed)
1L NS bolus given

## 2020-07-02 NOTE — ED Notes (Signed)
RN went w pt and cardiologist to cath lab

## 2020-07-02 NOTE — Progress Notes (Signed)
   07/02/20 6387  Clinical Encounter Type  Visited With Patient  Visit Type Code  Referral From Nurse  Consult/Referral To Chaplain   Chaplain responded to Code STEMI. Pt unavailable and no support person present. Chaplain let the Unit Secretary know to page if support person arrives. Chaplain remains available as needed.  This note was prepared by Chaplain Resident, Tacy Learn, MDiv. Chaplain remains available as needed through the on-call pager: [phone].

## 2020-07-02 NOTE — Progress Notes (Signed)
  Echocardiogram 2D Echocardiogram has been performed.  Eddie Joseph 07/02/2020, 3:20 PM

## 2020-07-02 NOTE — ED Provider Notes (Addendum)
[facility] Provider Note   CSN: [id number] Arrival date & time: 07/02/20  1941     History Chief Complaint  Patient presents with   Chest Pain    Eddie Joseph is a 64 y.o. male.  Pt presents to the ED today with cp.  The pt said cp started 2 hrs pta.  EMS did an EKG which showed a stemi.  The pt was given asa 324 mg and 300 cc NS en route.  Pt's BP in the 70s and HR in the 30s.         History reviewed. No pertinent past medical history.  There are no problems to display for this patient.   History reviewed. No pertinent surgical history.     No family history on file.  Social History   Tobacco Use   Smoking status: Current Every Day Smoker    Packs/day: 1.00    Types: Cigarettes   Smokeless tobacco: Never Used  Substance Use Topics   Alcohol use: Yes   Drug use: No    Home Medications Prior to Admission medications   Medication Sig Start Date End Date Taking? Authorizing Provider  amoxicillin (AMOXIL) 500 MG capsule Take 1 capsule (500 mg total) by mouth 3 (three) times daily. 06/04/14   Delos Haring, PA-C  HYDROcodone-acetaminophen (NORCO/VICODIN) 5-325 MG per tablet Take 1-2 tablets by mouth every 4 (four) hours as needed. 06/04/14   Delos Haring, PA-C  ibuprofen (ADVIL,MOTRIN) 200 MG tablet Take 200-400 mg by mouth every 6 (six) hours as needed.    [provider]    Allergies    Patient has no known allergies.  Review of Systems   Review of Systems  Cardiovascular: Positive for chest pain.  All other systems reviewed and are negative.   Physical Exam Updated Vital Signs BP (!) 75/54 (BP Location: Left Arm)    Pulse (!) 37    Resp 17    Ht _0  (1.778 m)    Wt 68 kg    SpO2 100%    BMI 21.52 kg/m   Physical Exam Vitals and nursing note reviewed.  Constitutional:      General: He is in acute distress.     Appearance: He is ill-appearing and diaphoretic.  HENT:     Head:  Normocephalic and atraumatic.  Eyes:     Extraocular Movements: Extraocular movements intact.     Pupils: Pupils are equal, round, and reactive to light.  Cardiovascular:     Rate and Rhythm: Regular rhythm. Bradycardia present.     Heart sounds: Normal heart sounds.  Pulmonary:     Effort: Pulmonary effort is normal.     Breath sounds: Normal breath sounds.  Abdominal:     General: Bowel sounds are normal.     Palpations: Abdomen is soft.  Musculoskeletal:        General: Normal range of motion.     Cervical back: Normal range of motion and neck supple.  Skin:    General: Skin is warm.     Capillary Refill: Capillary refill takes less than 2 seconds.  Neurological:     General: No focal deficit present.     Mental Status: He is alert and oriented to person, place, and time.  Psychiatric:        Mood and Affect: Mood normal.        Behavior: Behavior normal.     ED Results / Procedures / Treatments  Labs (all labs ordered are listed, but only abnormal results are displayed) Labs Reviewed  CBC WITH DIFFERENTIAL/PLATELET - Abnormal; Notable for the following components:      Result Value   WBC 11.8 (*)    MCV 101.7 (*)    Abs Immature Granulocytes 0.09 (*)    All other components within normal limits  APTT - Abnormal; Notable for the following components:   aPTT 22 (*)    All other components within normal limits  POCT I-STAT, CHEM 8 - Abnormal; Notable for the following components:   Glucose, Bld 141 (*)    Calcium, Ion 1.03 (*)    TCO2 20 (*)    All other components within normal limits  RESPIRATORY PANEL BY RT PCR (FLU A&B, COVID)  PROTIME-INR  HEMOGLOBIN A1C  COMPREHENSIVE METABOLIC PANEL  LIPID PANEL  I-STAT CHEM 8, ED  TROPONIN I (HIGH SENSITIVITY)    EKG EKG Interpretation  Date/Time:  Sunday July 02 2020 06:57:49 EST Ventricular Rate:  41 PR Interval:    QRS Duration: 111 QT Interval:  491 QTC Calculation: 406 R Axis:   100 Text  Interpretation: Atrial fibrillation Ventricular premature complex Inferior infarct, acute (RCA) Borderline ST elevation, anterior leads Probable RV involvement, suggest recording right precordial leads >>> Acute MI <<< No significant change since last tracing Confirmed by Isla Pence [phone]) on 07/02/2020 7:08:36 AM   Radiology No results found.  Procedures Procedures (including critical care time)  Medications Ordered in ED Medications  0.9 %  sodium chloride infusion (has no administration in time range)  heparin injection 4,000 Units ( Intravenous MAR Hold 07/02/20 0706)  DOPamine (INTROPIN) 3.2-5 MG/ML-% infusion (has no administration in time range)    ED Course  I have reviewed the triage vital signs and the nursing notes.  Pertinent labs & imaging results that were available during my care of the patient were reviewed by me and considered in my medical decision making (see chart for details).    MDM Rules/Calculators/A&P                          Code Stemi called by EMS.  Pt met by the cards fellow.  Pt started on dopamine and pt also given IVFs.  HR still in the 40s.  BP in the 70s.  Pt in cardiogenic shock.  Dopamine titrated up.  Dr. Burt Knack here to take pt emergentyl to the cath lab.   Final Clinical Impression(s) / ED Diagnoses Final diagnoses:  ST elevation myocardial infarction (STEMI), unspecified artery ([place])  Cardiogenic shock ([place])  Bradycardia    Rx / DC Orders ED Discharge Orders    None       Isla Pence, MD 07/02/20 Gloriajean Dell    Isla Pence, MD 07/03/20 [phone]

## 2020-07-02 NOTE — ED Triage Notes (Signed)
Pt c/o cp starting ab 2 hrs ago- EMS states pt was cool, clammy, diaphoretic on arrival. EKG showed inferior STEMI. Given 324asa en route & 300cc NS. Pt is bradycardic in ED hr uin 30s

## 2020-07-02 NOTE — ED Notes (Signed)
Dopamine increased to 36mcg/kg/min

## 2020-07-02 NOTE — ED Notes (Signed)
Heparin bolus given (5,000 units)

## 2020-07-02 NOTE — ED Notes (Signed)
Dopamine started at 10 mcg/kg/min.

## 2020-07-02 NOTE — H&P (Signed)
Cardiology Admission History and Physical:   Patient ID: Eddie Joseph MRN: [id number]; DOB: November 27, 1955   Admission date: 07/02/2020  Primary Care Provider: No primary care provider on file. [facility] Cardiologist: No primary care provider on file.  [facility] Electrophysiologist:  None   Chief Complaint:  Chest pain  Patient Profile:   Eddie Joseph is a 64 y.o. male with no past cardiac history presents with an acute inferior STEMI from the field  History of Present Illness:   Mr. Bacci has no past medical history.  He awoke this morning with severe substernal chest pain and called 911.  He was staying in a local hotel room and was evaluated by EMS, found to have inferior STEMI with hypotension and bradycardia requiring dopamine.  At the time of my evaluation in the emergency department, the patient continues to complain of 10/10 substernal chest pain that has been present for approximately 1 hour.  He is complaining of "burning up."  He has no other specific complaints and it is difficult to elicit a history because of his pain and restlessness.  He reports no past medical problems.  He takes a lot of ibuprofen to control back pain.  He does not take any home medications.  He smokes cigarettes and drinks alcohol on a daily basis.  History reviewed. No pertinent past medical history.  History reviewed. No pertinent surgical history.   Medications Prior to Admission: Prior to Admission medications   Medication Sig Start Date End Date Taking? Authorizing Provider  amoxicillin (AMOXIL) 500 MG capsule Take 1 capsule (500 mg total) by mouth 3 (three) times daily. 06/04/14   Marlon Pel, PA-C  HYDROcodone-acetaminophen (NORCO/VICODIN) 5-325 MG per tablet Take 1-2 tablets by mouth every 4 (four) hours as needed. 06/04/14   Marlon Pel, PA-C  ibuprofen (ADVIL,MOTRIN) 200 MG tablet Take 200-400 mg by mouth every 6 (six) hours as needed.    [provider]      Allergies:   No Known Allergies  Social History:   Social History   Socioeconomic History  . Marital status: Single    Spouse name: Not on file  . Number of children: Not on file  . Years of education: Not on file  . Highest education level: Not on file  Occupational History  . Not on file  Tobacco Use  . Smoking status: Current Every Day Smoker    Packs/day: 1.00    Types: Cigarettes  . Smokeless tobacco: Never Used  Substance and Sexual Activity  . Alcohol use: Yes  . Drug use: No  . Sexual activity: Not on file  Other Topics Concern  . Not on file  Social History Narrative  . Not on file   Social Determinants of Health   Financial Resource Strain:   . Difficulty of Paying Living Expenses: Not on file  Food Insecurity:   . Worried About Programme researcher, broadcasting/film/video in the Last Year: Not on file  . Ran Out of Food in the Last Year: Not on file  Transportation Needs:   . Lack of Transportation (Medical): Not on file  . Lack of Transportation (Non-Medical): Not on file  Physical Activity:   . Days of Exercise per Week: Not on file  . Minutes of Exercise per Session: Not on file  Stress:   . Feeling of Stress : Not on file  Social Connections:   . Frequency of Communication with Friends and Family: Not on file  . Frequency of Social  Gatherings with Friends and Family: Not on file  . Attends Religious Services: Not on file  . Active Member of Clubs or Organizations: Not on file  . Attends Banker Meetings: Not on file  . Marital Status: Not on file  Intimate Partner Violence:   . Fear of Current or Ex-Partner: Not on file  . Emotionally Abused: Not on file  . Physically Abused: Not on file  . Sexually Abused: Not on file    Family History:   The patient's family history is negative for premature CAD  ROS:  Please see the history of present illness.  All other ROS reviewed and negative.   Limited history based on current clinical status  Physical  Exam/Data:   Vitals:   07/02/20 0822 07/02/20 0824 07/02/20 0829 07/02/20 0834  BP:  (!) 84/56 (!) 92/57 92/61  Pulse: (!) 0 (!) 0 (!) 0 (!) 0  Resp: (!) 39 (!) 0 (!) 0 (!) 0  TempSrc:      SpO2: (!) 0% (!) 0% (!) 0% (!) 0%  Weight:      Height:       No intake or output data in the 24 hours ending 07/02/20 0844 Last 3 Weights 07/02/2020 07/02/2020 06/04/2014  Weight (lbs) 150 lb 150 lb 145 lb  Weight (kg) 68.04 kg 68.04 kg 65.772 kg     Body mass index is 21.52 kg/m.  General: Patient is restless, uncomfortable, diaphoretic, moderate distress HEENT: normal Lymph: no adenopathy Neck: no JVD Endocrine:  No thryomegaly Vascular: No carotid bruits; FA pulses 1+ bilaterally Cardiac:  normal S1, S2; distant heart sounds no murmur appreciated Lungs:  clear to auscultation bilaterally, no wheezing, rhonchi or rales  Abd: soft, nontender, no hepatomegaly  Ext: no edema Musculoskeletal:  No deformities, BUE and BLE strength normal and equal Skin: Diaphoretic Neuro:  CNs 2-12 intact, no focal abnormalities noted Psych: Difficult to assess, patient uncomfortable   EKG:  The ECG that was done was personally reviewed and demonstrates junctional rhythm, marked bradycardia, acute inferior injury  Relevant CV Studies: Pending  Laboratory Data:  High Sensitivity Troponin:   Recent Labs  Lab 07/02/20 0652  TROPONINIHS 446*      Chemistry Recent Labs  Lab 07/02/20 0652 07/02/20 0710  NA 139 139  K 4.1 3.9  CL 106 109  CO2 20*  --   GLUCOSE 145* 141*  BUN 12 13  CREATININE 1.38* 1.20  CALCIUM 8.6*  --   GFRNONAA 57*  --   ANIONGAP 13  --     Recent Labs  Lab 07/02/20 0652  PROT 5.8*  ALBUMIN 3.2*  AST 29  ALT 21  ALKPHOS 95  BILITOT 0.6   Hematology Recent Labs  Lab 07/02/20 0652 07/02/20 0710  WBC 11.8*  --   RBC 4.71  --   HGB 15.6 15.0  HCT 47.9 44.0  MCV 101.7*  --   MCH 33.1  --   MCHC 32.6  --   RDW 13.3  --   PLT 216  --    BNPNo results for  input(s): BNP, PROBNP in the last 168 hours.  DDimer No results for input(s): DDIMER in the last 168 hours.   Radiology/Studies:  No results found.   Assessment and Plan:   1. Acute inferior STEMI 2. Cardiogenic shock, on dopamine 20 mcg  The patient is taken emergently to the cardiac catheterization lab for cardiac catheterization and PCI.  Emergency implied consent is obtained.  I explained the procedure as well as its risks and indications to the patient on the way to the Cath Lab.  He will be treated with IV heparin and he has received aspirin.  Will institute post MI medical therapy with high intensity statin drug, beta-blocker as tolerated, and dual antiplatelet therapy as indicated.  The patient may require hemodynamic support measures, temporary pacing, or other invasive therapies to support him as his presentation is complicated by cardiogenic shock and marked bradycardia.  Further plans/disposition pending his clinical course in the cardiac Cath Lab.      TIMI Risk Score for ST  Elevation MI:   The patient's TIMI risk score is 6, which indicates a 16.1% risk of all cause mortality at 30 days.       CHA2DS2-VASc Score = 1  This indicates a 0.6% annual risk of stroke. The patient's score is based upon: CHF History: 0 HTN History: 0 Diabetes History: 0 Stroke History: 0 Vascular Disease History: 1 Age Score: 0 Gender Score: 0     Severity of Illness: The appropriate patient status for this patient is INPATIENT. Inpatient status is judged to be reasonable and necessary in order to provide the required intensity of service to ensure the patient's safety. The patient's presenting symptoms, physical exam findings, and initial radiographic and laboratory data in the context of their chronic comorbidities is felt to place them at high risk for further clinical deterioration. Furthermore, it is not anticipated that the patient will be medically stable for discharge from the  hospital within 2 midnights of admission. The following factors support the patient status of inpatient.   " The patient's presenting symptoms include chest pain. " The worrisome physical exam findings include hypotension/bradycardia. " The initial radiographic and laboratory data are worrisome because of STEMI. " The chronic co-morbidities include tobacco use.   * I certify that at the point of admission it is my clinical judgment that the patient will require inpatient hospital care spanning beyond 2 midnights from the point of admission due to high intensity of service, high risk for further deterioration and high frequency of surveillance required.*    For questions or updates, please contact [facility] Please consult [email or web address] for contact info under     Signed, Tonny Bollman, MD  07/02/2020 8:44 AM

## 2020-07-03 ENCOUNTER — Other Ambulatory Visit: Payer: Self-pay

## 2020-07-03 ENCOUNTER — Encounter (HOSPITAL_COMMUNITY): Payer: Self-pay | Admitting: Cardiovascular Disease

## 2020-07-03 DIAGNOSIS — I4891 Unspecified atrial fibrillation: Secondary | ICD-10-CM

## 2020-07-03 LAB — LIPID PANEL
Cholesterol: 209 mg/dL — ABNORMAL HIGH (ref 0–200)
HDL: 26 mg/dL — ABNORMAL LOW (ref >40)
LDL Cholesterol: 144 mg/dL — ABNORMAL HIGH (ref 0–99)
Total CHOL/HDL Ratio: 8 RATIO
Triglycerides: 193 mg/dL — ABNORMAL HIGH (ref <150)
VLDL: 39 mg/dL (ref 0–40)

## 2020-07-03 LAB — BASIC METABOLIC PANEL
Anion gap: 7 (ref 5–15)
BUN: 12 mg/dL (ref 8–23)
CO2: 20 mmol/L — ABNORMAL LOW (ref 22–32)
Calcium: 8.4 mg/dL — ABNORMAL LOW (ref 8.9–10.3)
Chloride: 110 mmol/L (ref 98–111)
Creatinine, Ser: 0.96 mg/dL (ref 0.61–1.24)
GFR, Estimated: >60 mL/min (ref >60)
Glucose, Bld: 109 mg/dL — ABNORMAL HIGH (ref 70–99)
Potassium: 3.7 mmol/L (ref 3.5–5.1)
Sodium: 137 mmol/L (ref 135–145)

## 2020-07-03 LAB — POCT ACTIVATED CLOTTING TIME
Activated Clotting Time: 230 seconds
Activated Clotting Time: 235 seconds

## 2020-07-03 LAB — CBC
HCT: 39.9 % (ref 39.0–52.0)
Hemoglobin: 13.5 g/dL (ref 13.0–17.0)
MCH: 33.3 pg (ref 26.0–34.0)
MCHC: 33.8 g/dL (ref 30.0–36.0)
MCV: 98.5 fL (ref 80.0–100.0)
Platelets: 172 10*3/uL (ref 150–400)
RBC: 4.05 MIL/uL — ABNORMAL LOW (ref 4.22–5.81)
RDW: 13.5 % (ref 11.5–15.5)
WBC: 9.7 10*3/uL (ref 4.0–10.5)
nRBC: 0 % (ref 0.0–0.2)

## 2020-07-03 MED ORDER — LISINOPRIL 2.5 MG PO TABS: 2.5000 mg | ORAL_TABLET | Freq: Every day | ORAL | Status: DC

## 2020-07-03 MED ORDER — LOSARTAN POTASSIUM 25 MG PO TABS
12.5000 mg | ORAL_TABLET | Freq: Every day | ORAL | Status: DC
  Administered 2020-07-03 – 2020-07-04 (×2): 12.5 mg via ORAL
  Filled 2020-07-03 (×2): qty 1

## 2020-07-03 MED ORDER — METOPROLOL TARTRATE 12.5 MG HALF TABLET
12.5000 mg | ORAL_TABLET | Freq: Two times a day (BID) | ORAL | Status: DC
  Administered 2020-07-03 – 2020-07-04 (×3): 12.5 mg via ORAL
  Filled 2020-07-03 (×3): qty 1

## 2020-07-03 MED FILL — Cefazolin Sodium-Dextrose IV Solution 2 GM/100ML-4%: INTRAVENOUS | Qty: 100 | Status: AC

## 2020-07-03 NOTE — Progress Notes (Addendum)
Progress Note  Patient Name: Eddie BambergerDonald R Nauert Date of Encounter: 07/03/2020  Primary Cardiologist: new, Dr. Excell Seltzerooper  Subjective   No recurrent chest pain  Inpatient Medications    Scheduled Meds: . aspirin EC  81 mg Oral Daily  . atorvastatin  80 mg Oral Daily  . Chlorhexidine Gluconate Cloth  6 each Topical Daily  . heparin  5,000 Units Subcutaneous Q8H  . sodium chloride flush  3 mL Intravenous Q12H  . ticagrelor  90 mg Oral BID   Continuous Infusions: . sodium chloride Stopped (07/02/20 1913)  . sodium chloride    . sodium chloride Stopped (07/02/20 1000)  . amiodarone Stopped (07/03/20 0556)  . norepinephrine (LEVOPHED) Adult infusion Stopped (07/02/20 1422)   PRN Meds: sodium chloride, acetaminophen, nitroGLYCERIN, ondansetron (ZOFRAN) IV, sodium chloride flush   Vital Signs    Vitals:   07/03/20 0400 07/03/20 0500 07/03/20 0600 07/03/20 0734  BP: 123/76 134/74 137/74   Pulse: 73 66 67   Resp: 18 17 18    Temp: 98 F (36.7 C)   98.3 F (36.8 C)  TempSrc: Oral   Oral  SpO2: 95% 95% 95%   Weight:      Height:        Intake/Output Summary (Last 24 hours) at 07/03/2020 0810 Last data filed at 07/03/2020 0600 Gross per 24 hour  Intake 1759.05 ml  Output 802 ml  Net 957.05 ml    I/O since admission: +989  Filed Weights   07/02/20 0645 07/02/20 0650  Weight: 68 kg 68 kg    Telemetry    Sinus    - Personally Reviewed  ECG    ECG (independently read by me): NSR 73, T inversion III, aVF  Physical Exam    BP 137/74   Pulse 67   Temp 98.3 F (36.8 C) (Oral)   Resp 18   Ht 5\' 10"  (1.778 m)   Wt 68 kg   SpO2 95%   BMI 21.52 kg/m  General: Alert, oriented, no distress. Appears older than stated age Skin: normal turgor, no rashes, warm and dry HEENT: Normocephalic, atraumatic. Pupils equal round and reactive to light; sclera anicteric; extraocular muscles intact;  Nose without nasal septal hypertrophy Mouth/Parynx benign;  Neck: No JVD, no  carotid bruits; normal carotid upstroke Lungs: clear to ausculatation and percussion; no wheezing or rales Chest wall: without tenderness to palpitation Heart: PMI not displaced, RRR, s1 s2 normal, 1/6 systolic murmur, no diastolic murmur, no rubs, gallops, thrills, or heaves Abdomen: soft, nontender; no hepatosplenomehaly, BS+; abdominal aorta nontender and not dilated by palpation. Back: no CVA tenderness Pulses 2+: R groin site stable Musculoskeletal: full range of motion, normal strength, no joint deformities Extremities: no clubbing cyanosis or edema, Homan's sign negative  Neurologic: grossly nonfocal; Cranial nerves grossly wnl Psychologic: Normal mood and affect   Labs    Chemistry Recent Labs  Lab 07/02/20 0652 07/02/20 0652 07/02/20 0710 07/02/20 1211 07/03/20 0430  NA 139  --  139  --  137  K 4.1  --  3.9  --  3.7  CL 106  --  109  --  110  CO2 20*  --   --   --  20*  GLUCOSE 145*  --  141*  --  109*  BUN 12  --  13  --  12  CREATININE 1.38*   < > 1.20 1.19 0.96  CALCIUM 8.6*  --   --   --  8.4*  PROT  5.8*  --   --   --   --   ALBUMIN 3.2*  --   --   --   --   AST 29  --   --   --   --   ALT 21  --   --   --   --   ALKPHOS 95  --   --   --   --   BILITOT 0.6  --   --   --   --   GFRNONAA 57*  --   --  >60 >60  ANIONGAP 13  --   --   --  7   < > = values in this interval not displayed.     Hematology Recent Labs  Lab 07/02/20 0652 07/02/20 0652 07/02/20 0710 07/02/20 1211 07/03/20 0430  WBC 11.8*  --   --  11.7* 9.7  RBC 4.71  --   --  4.26 4.05*  HGB 15.6   < > 15.0 14.3 13.5  HCT 47.9   < > 44.0 41.9 39.9  MCV 101.7*  --   --  98.4 98.5  MCH 33.1  --   --  33.6 33.3  MCHC 32.6  --   --  34.1 33.8  RDW 13.3  --   --  13.5 13.5  PLT 216  --   --  217 172   < > = values in this interval not displayed.    Hs trop:  25,012  Cardiac EnzymesNo results for input(s): TROPONINI in the last 168 hours. No results for input(s): TROPIPOC in the last 168  hours.   BNPNo results for input(s): BNP, PROBNP in the last 168 hours.   DDimer No results for input(s): DDIMER in the last 168 hours.   Lipid Panel     Component Value Date/Time   CHOL 209 (H) 07/03/2020 0430   TRIG 193 (H) 07/03/2020 0430   HDL 26 (L) 07/03/2020 0430   CHOLHDL 8.0 07/03/2020 0430   VLDL 39 07/03/2020 0430   LDLCALC 144 (H) 07/03/2020 0430     Radiology    CARDIAC CATHETERIZATION  Result Date: 07/02/2020 1.  Total occlusion of the proximal RCA, treated successfully with primary PCI using a 3.5 x 38 mm resolute Onyx DES 2.  Moderate ostial and proximal LAD stenoses, 50 to 70% range luminal narrowing 3.  Patent left main and left circumflex with minimal luminal irregularities 4.  Normal LV systolic function with normal LVEDP 5.  Cardiogenic shock treated with vasopressor therapy and reperfusion therapy Recommendations: ICU care, dual antiplatelet therapy with aspirin and ticagrelor at least 12 months (ticagrelor 180 mg administered orally at the end of the procedure), tirofiban x6 hours, low-dose norepinephrine infusion at completion of procedure titrate to MAP greater than 65 or SBP greater than 90, IV amiodarone for treatment of atrial fibrillation  DG Chest Port 1 View  Result Date: 07/02/2020 CLINICAL DATA:  Chest pain, history coronary artery disease post STEMI and coronary stenting EXAM: PORTABLE CHEST 1 VIEW COMPARISON:  Portable exam 0902 hours without priors for comparison FINDINGS: Normal heart size, mediastinal contours, and pulmonary vascularity. Peribronchial thickening with minimal subsegmental atelectasis at LEFT base. No acute infiltrate, pleural effusion, or pneumothorax. Osseous structures unremarkable. IMPRESSION: Bronchitic changes with minimal subsegmental atelectasis at LEFT base. Electronically Signed   By: Ulyses Southward M.D.   On: 07/02/2020 10:03   ECHOCARDIOGRAM COMPLETE  Result Date: 07/02/2020    ECHOCARDIOGRAM REPORT   Patient Name:  Eddie Joseph Date of Exam: 07/02/2020 Medical Rec #:  [id number]       Height:       70.0 in Accession #:    [id number]      Weight:       150.0 lb Date of Birth:  1955-12-23       BSA:          1.847 m Patient Age:    64 years        BP:           104/62 mmHg Patient Gender: M               HR:           67 bpm. Exam Location:  Inpatient Procedure: 2D Echo, Cardiac Doppler, Color Doppler and Intracardiac            Opacification Agent Indications:    Chest Pain 786.50 / R07.9  History:        Patient has no prior history of Echocardiogram examinations.                 Arrythmias:STEMI and Atrial Fibrillation; Risk Factors:Current                 Smoker.  Sonographer:    Renella Cunas RDCS Referring Phys: [phone] MICHAEL COOPER  Sonographer Comments: Image acquisition challenging due to respiratory motion. IMPRESSIONS  1. Left ventricular ejection fraction, by estimation, is 50 to 55%. The left ventricle has normal function. The left ventricle has no regional wall motion abnormalities. Left ventricular diastolic parameters are indeterminate.  2. Right ventricular systolic function is normal. The right ventricular size is normal.  3. The mitral valve is normal in structure. No evidence of mitral valve regurgitation. No evidence of mitral stenosis.  4. The aortic valve was not well visualized. Aortic valve regurgitation is not visualized. No aortic stenosis is present.  5. The inferior vena cava is normal in size with greater than 50% respiratory variability, suggesting right atrial pressure of 3 mmHg. FINDINGS  Left Ventricle: Left ventricular ejection fraction, by estimation, is 50 to 55%. The left ventricle has normal function. The left ventricle has no regional wall motion abnormalities. Definity contrast agent was given IV to delineate the left ventricular  endocardial borders. The left ventricular internal cavity size was normal in size. There is no left ventricular hypertrophy. Left ventricular diastolic parameters are  indeterminate. Right Ventricle: The right ventricular size is normal. No increase in right ventricular wall thickness. Right ventricular systolic function is normal. Left Atrium: Left atrial size was normal in size. Right Atrium: Right atrial size was normal in size. Pericardium: There is no evidence of pericardial effusion. Mitral Valve: The mitral valve is normal in structure. No evidence of mitral valve regurgitation. No evidence of mitral valve stenosis. Tricuspid Valve: The tricuspid valve is normal in structure. Tricuspid valve regurgitation is not demonstrated. No evidence of tricuspid stenosis. Aortic Valve: The aortic valve was not well visualized. Aortic valve regurgitation is not visualized. No aortic stenosis is present. Aortic valve mean gradient measures 2.5 mmHg. Aortic valve peak gradient measures 6.1 mmHg. Aortic valve area, by VTI measures 1.96 cm. Pulmonic Valve: The pulmonic valve was not well visualized. Pulmonic valve regurgitation is not visualized. No evidence of pulmonic stenosis. Aorta: The aortic root is normal in size and structure. Venous: The inferior vena cava is normal in size with greater than 50% respiratory variability, suggesting right atrial pressure of 3  mmHg. IAS/Shunts: No atrial level shunt detected by color flow Doppler.  LEFT VENTRICLE PLAX 2D LVIDd:         4.00 cm      Diastology LVIDs:         3.30 cm      LV e' medial:    6.73 cm/s LV PW:         0.80 cm      LV E/e' medial:  13.6 LV IVS:        0.80 cm      LV e' lateral:   6.73 cm/s LVOT diam:     1.80 cm      LV E/e' lateral: 13.6 LV SV:         35 LV SV Index:   19 LVOT Area:     2.54 cm  LV Volumes (MOD) LV vol d, MOD A2C: 95.7 ml LV vol d, MOD A4C: 132.0 ml LV vol s, MOD A2C: 45.9 ml LV vol s, MOD A4C: 81.5 ml LV SV MOD A2C:     49.8 ml LV SV MOD A4C:     132.0 ml LV SV MOD BP:      52.8 ml RIGHT VENTRICLE RV S prime:     9.32 cm/s TAPSE (M-mode): 1.6 cm LEFT ATRIUM             Index       RIGHT ATRIUM            Index LA diam:        3.40 cm 1.84 cm/m  RA Area:     14.00 cm LA Vol (A2C):   51.0 ml 27.61 ml/m RA Volume:   36.40 ml  19.71 ml/m LA Vol (A4C):   41.3 ml 22.36 ml/m LA Biplane Vol: 47.2 ml 25.55 ml/m  AORTIC VALVE AV Area (Vmax):    1.77 cm AV Area (Vmean):   1.55 cm AV Area (VTI):     1.96 cm AV Vmax:           123.06 cm/s AV Vmean:          73.048 cm/s AV VTI:            0.180 m AV Peak Grad:      6.1 mmHg AV Mean Grad:      2.5 mmHg LVOT Vmax:         85.60 cm/s LVOT Vmean:        44.500 cm/s LVOT VTI:          0.139 m LVOT/AV VTI ratio: 0.77  AORTA Ao Root diam: 3.50 cm MITRAL VALVE MV Area (PHT): 4.06 cm    SHUNTS MV Decel Time: 187 msec    Systemic VTI:  0.14 m MV E velocity: 91.70 cm/s  Systemic Diam: 1.80 cm MV A velocity: 78.40 cm/s MV E/A ratio:  1.17 Dina Rich MD Electronically signed by Dina Rich MD Signature Date/Time: 07/02/2020/3:19:08 PM    Final     Cardiac Studies   1.  Total occlusion of the proximal RCA, treated successfully with primary PCI using a 3.5 x 38 mm resolute Onyx DES 2.  Moderate ostial and proximal LAD stenoses, 50 to 70% range luminal narrowing 3.  Patent left main and left circumflex with minimal luminal irregularities 4.  Normal LV systolic function with normal LVEDP 5.  Cardiogenic shock treated with vasopressor therapy and reperfusion therapy  Recommendations: ICU care, dual antiplatelet therapy with aspirin and ticagrelor at least 12  months (ticagrelor 180 mg administered orally at the end of the procedure), tirofiban x6 hours, low-dose norepinephrine infusion at completion of procedure titrate to MAP greater than 65 or SBP greater than 90, IV amiodarone for treatment of atrial fibrillation   Intervention    ECHO: 07/02/20 IMPRESSIONS  1. Left ventricular ejection fraction, by estimation, is 50 to 55%. The  left ventricle has normal function. The left ventricle has no regional  wall motion abnormalities. Left ventricular diastolic  parameters are  indeterminate.  2. Right ventricular systolic function is normal. The right ventricular  size is normal.  3. The mitral valve is normal in structure. No evidence of mitral valve  regurgitation. No evidence of mitral stenosis.  4. The aortic valve was not well visualized. Aortic valve regurgitation  is not visualized. No aortic stenosis is present.  5. The inferior vena cava is normal in size with greater than 50%  respiratory variability, suggesting right atrial pressure of 3 mmHg.   Patient Profile     64 y.o. male who presented to [facility] the morning of July 02, 2020 with acute inferior lateral STEMI complicated by cardiogenic shock requiring pressure support and atrial fibrillation.  Assessment & Plan    1. Day 1 s/p Inferior STEMI/cardiogenic shock: Patient had first noticed some vague chest discomfort 2 weeks previously which he contributed to indigestion.  Yesterday morning at approximately 4:30 AM after awakening to go to the bathroom he developed severe chest pain associated with diaphoresis dizziness and was found to have acute inferior STEMI complicated by cardiogenic shock requiring pressor support.  Catheterization revealed total proximal RCA occlusion.  The patient had gone into atrial fibrillation and was started on amiodarone.  He underwent successful DES stenting to his RCA.  Subsequently, telemetry reveals that he has converted to sinus rhythm.  ECG today shows marked improvement from his presenting ECG which had shown 3 to 4 mm of inferior lateral ST elevation  with PVCs and probable atrial fibrillation with slow ventricular response although some P waves were present.  Following successful reperfusion, he is chest pain-free today.  His echo Doppler study has shown an EF of 50 to 55% without definitive wall motion abnormalities.  He is on DAPT with aspirin/ticagrelor.  He is no longer on pressor support with stable blood pressure.  Will DC amiodarone  and initiate low-dose beta-blocker therapy as well as low-dose ACE/ARB inhibition.  2.  Atrial fibrillation: In the setting of inferior STEMI.  He has been on amiodarone.  ECG today shows normal sinus rhythm in the 70s.  Will DC amiodarone and initiate low-dose losartan initially at 12.5 mg twice a day..  3.  Hyperlipidemia: LDL cholesterol 144, lipid panel consistent with a mixed hyperlipidemic profile.  Atorvastatin 80 mg has been initiated.  4.  Longstanding tobacco abuse: Patient has been smoking for greater than 45 years.  Smoking cessation strongly advised.  5.  Acute renal insufficiency: Initial creatinine 1.38 now improved to 0.9 with reperfusion and hydration  We will attempt ambulation this morning.  Cardiac rehab.  Monitor closely.  Possibly transfer to cardiac telemetry later today or tomorrow.  Signed, Lennette Bihari, MD, [facility] 07/03/2020, 8:10 AM

## 2020-07-03 NOTE — Progress Notes (Signed)
   07/03/20 0900  Clinical Encounter Type  Visited With Patient  Visit Type Initial  Referral From Nurse  Consult/Referral To Chaplain  Spiritual Encounters  Spiritual Needs Literature  The chaplain responded to a spiritual consult for an advanced directive. The chaplain spoke to the patient and provided advance directive education. The patient understands the need to have one on file. The chaplain told the patient to complete the paperwork and if there were any questions to have them answered before notarizing. The chaplain will follow up as needed.

## 2020-07-03 NOTE — Progress Notes (Signed)
CARDIAC REHAB PHASE I   PRE:  Rate/Rhythm: 68 SR  BP:  Sitting: 146/73      SaO2: 96 RA  MODE:  Ambulation: 370 ft   POST:  Rate/Rhythm: 81 SR  BP:  Sitting: 131/101 --> 127/66    SaO2: 97 RA  Pt ambulated 353ft in hallway standby assist with slow wobbly gait. Pt states unsteady gait is preexisting due to an inner ear problem. Pt with some visible SOB, but denies CP or dizziness. Pt returned to recliner. Pt educated on importance of ASA, Brilinta, statin, and NTG. Pt given MI book. Reviewed restrictions. Will f/u tomorrow to complete education to not overwhelm the pt.   [id number] Reynold Bowen, RN BSN 07/03/2020 9:53 AM

## 2020-07-04 ENCOUNTER — Other Ambulatory Visit (HOSPITAL_COMMUNITY): Payer: Self-pay | Admitting: Cardiology

## 2020-07-04 DIAGNOSIS — R001 Bradycardia, unspecified: Secondary | ICD-10-CM

## 2020-07-04 DIAGNOSIS — Z720 Tobacco use: Secondary | ICD-10-CM

## 2020-07-04 MED ORDER — METOPROLOL TARTRATE 25 MG PO TABS
12.5000 mg | ORAL_TABLET | Freq: Two times a day (BID) | ORAL | 0 refills | Status: DC
Start: 2020-07-04 — End: 2020-07-04

## 2020-07-04 MED ORDER — NITROGLYCERIN 0.4 MG SL SUBL: 0.4000 mg | SUBLINGUAL_TABLET | SUBLINGUAL | 3 refills | Status: DC | PRN

## 2020-07-04 MED ORDER — NITROGLYCERIN 0.4 MG SL SUBL: 0.4000 mg | SUBLINGUAL_TABLET | SUBLINGUAL | 0 refills | Status: DC | PRN

## 2020-07-04 MED ORDER — ASPIRIN 81 MG PO TBEC
81.0000 mg | DELAYED_RELEASE_TABLET | Freq: Every day | ORAL | 0 refills | Status: DC
Start: 2020-07-04 — End: 2020-07-04

## 2020-07-04 MED ORDER — ATORVASTATIN CALCIUM 80 MG PO TABS
80.0000 mg | ORAL_TABLET | Freq: Every day | ORAL | 3 refills | Status: DC
Start: 2020-07-04 — End: 2020-07-04

## 2020-07-04 MED ORDER — TICAGRELOR 90 MG PO TABS
90.0000 mg | ORAL_TABLET | Freq: Two times a day (BID) | ORAL | 0 refills | Status: DC
Start: 2020-07-04 — End: 2020-07-04

## 2020-07-04 MED ORDER — METOPROLOL TARTRATE 25 MG PO TABS
12.5000 mg | ORAL_TABLET | Freq: Two times a day (BID) | ORAL | 3 refills | Status: DC
Start: 2020-07-04 — End: 2020-07-18

## 2020-07-04 MED ORDER — TICAGRELOR 90 MG PO TABS
90.0000 mg | ORAL_TABLET | Freq: Two times a day (BID) | ORAL | 3 refills | Status: DC
Start: 2020-07-04 — End: 2020-07-18

## 2020-07-04 MED ORDER — ASPIRIN 81 MG PO TBEC
81.0000 mg | DELAYED_RELEASE_TABLET | Freq: Every day | ORAL | 3 refills | Status: DC
Start: 2020-07-04 — End: 2020-07-04

## 2020-07-04 MED ORDER — TICAGRELOR 90 MG PO TABS
90.0000 mg | ORAL_TABLET | Freq: Two times a day (BID) | ORAL | 3 refills | Status: DC
Start: 2020-07-04 — End: 2020-07-04

## 2020-07-04 MED ORDER — LOSARTAN POTASSIUM 25 MG PO TABS
12.5000 mg | ORAL_TABLET | Freq: Every day | ORAL | 3 refills | Status: DC
Start: 2020-07-04 — End: 2020-07-04

## 2020-07-04 MED ORDER — LOSARTAN POTASSIUM 25 MG PO TABS
12.5000 mg | ORAL_TABLET | Freq: Every day | ORAL | 0 refills | Status: DC
Start: 2020-07-04 — End: 2020-07-04

## 2020-07-04 MED ORDER — METOPROLOL TARTRATE 25 MG PO TABS
12.5000 mg | ORAL_TABLET | Freq: Two times a day (BID) | ORAL | 3 refills | Status: DC
Start: 2020-07-04 — End: 2020-07-04

## 2020-07-04 MED ORDER — ATORVASTATIN CALCIUM 80 MG PO TABS
80.0000 mg | ORAL_TABLET | Freq: Every day | ORAL | 3 refills | Status: DC
Start: 2020-07-04 — End: 2020-07-18

## 2020-07-04 MED ORDER — ATORVASTATIN CALCIUM 80 MG PO TABS
80.0000 mg | ORAL_TABLET | Freq: Every day | ORAL | 0 refills | Status: DC
Start: 2020-07-04 — End: 2020-07-04

## 2020-07-04 MED ORDER — LOSARTAN POTASSIUM 25 MG PO TABS
12.5000 mg | ORAL_TABLET | Freq: Every day | ORAL | 3 refills | Status: DC
Start: 2020-07-04 — End: 2020-07-18

## 2020-07-04 MED FILL — ATORVASTATIN CALCIUM 80 MG: 80 | 30 days supply | Qty: 30 | Fill #0

## 2020-07-04 MED FILL — BRILINTA 90 MG TABLET: 90 | 30 days supply | Qty: 60 | Fill #0

## 2020-07-04 MED FILL — NITROGLYCERIN 0.4 MG TAB SL: 0.4 | 7 days supply | Qty: 25 | Fill #0

## 2020-07-04 MED FILL — METOPROLOL TARTRATE 25 MG T: 25 | 30 days supply | Qty: 30 | Fill #0

## 2020-07-04 MED FILL — ASPIRIN LOW DOSE 81 MG TBEC: 81 | 30 days supply | Qty: 30 | Fill #0

## 2020-07-04 MED FILL — LOSARTAN POTASSIUM 25 MG TA: 25 | 30 days supply | Qty: 15 | Fill #0

## 2020-07-04 NOTE — Care Management (Signed)
1007 07-04-20 Patient presented for chest pain. He currently is without insurance. MATCH completed for this patient. Previous Case Manager scheduled patient a hospital follow up appointment at the [facility] and [facility]. Gala Lewandowsky, RN,BSN Case Manager

## 2020-07-04 NOTE — Progress Notes (Signed)
CARDIAC REHAB PHASE I   PRE:  Rate/Rhythm: 73 SR  BP:  Sitting: 122/76      MODE:  Ambulation: 1000 ft   POST:  Rate/Rhythm: 88 SR  BP:  Sitting: 117/77    PT ambulated 1031ft in hallway independently with steady gait. Pt denies CP, dizziness, or SOB. Reviewed restrictions, site care, and exercise guidelines. Reinforced importance of ASA and Brilinta. Pt referred to CRP [facility] [place].  [id number] Reynold Bowen, RN BSN 07/04/2020 8:57 AM

## 2020-07-04 NOTE — Discharge Summary (Addendum)
Discharge Summary    Patient ID: Eddie Joseph MRN: [id number]; DOB: 03-01-1956  Admit date: 07/02/2020 Discharge date: 07/04/2020  Primary Care Provider: Patient, No Pcp Per  Primary Cardiologist: Tonny Bollman, MD  Primary Electrophysiologist:  None   Discharge Diagnoses    Active Problems:   Acute ST elevation myocardial infarction (STEMI) due to occlusion of right coronary artery [facility])   Cardiogenic shock (HCC)   Atrial fibrillation with rapid ventricular response [facility])   STEMI involving right coronary artery (HCC)   ST elevation myocardial infarction (STEMI) (HCC)   Bradycardia   Tobacco abuse    Diagnostic Studies/Procedures    Cath: 07/02/20  1.  Total occlusion of the proximal RCA, treated successfully with primary PCI using a 3.5 x 38 mm resolute Onyx DES 2.  Moderate ostial and proximal LAD stenoses, 50 to 70% range luminal narrowing 3.  Patent left main and left circumflex with minimal luminal irregularities 4.  Normal LV systolic function with normal LVEDP 5.  Cardiogenic shock treated with vasopressor therapy and reperfusion therapy  Recommendations: ICU care, dual antiplatelet therapy with aspirin and ticagrelor at least 12 months (ticagrelor 180 mg administered orally at the end of the procedure), tirofiban x6 hours, low-dose norepinephrine infusion at completion of procedure titrate to MAP greater than 65 or SBP greater than 90, IV amiodarone for treatment of atrial fibrillation  Diagnostic Dominance: Right  Intervention   Echo: 07/02/20  IMPRESSIONS    1. Left ventricular ejection fraction, by estimation, is 50 to 55%. The  left ventricle has normal function. The left ventricle has no regional  wall motion abnormalities. Left ventricular diastolic parameters are  indeterminate.  2. Right ventricular systolic function is normal. The right ventricular  size is normal.  3. The mitral valve is normal in structure. No evidence of mitral valve   regurgitation. No evidence of mitral stenosis.  4. The aortic valve was not well visualized. Aortic valve regurgitation  is not visualized. No aortic stenosis is present.  5. The inferior vena cava is normal in size with greater than 50%  respiratory variability, suggesting right atrial pressure of 3 mmHg.  _____________   History of Present Illness     Eddie Joseph is a 64 y.o. male with no PMH reported. He awoke the morning of admisson with severe substernal chest pain and called 911.  He was staying in a local hotel room and was evaluated by EMS, found to have inferior STEMI with hypotension and bradycardia requiring dopamine. At the time of evaluation in the emergency department, the patient continued to complain of 10/10 substernal chest pain that had been present for approximately 1 hour.  He was complaining of "burning up."  He had no other specific complaints and it was difficult to elicit a history because of his pain and restlessness.  He reported no past medical problems.  He takes a lot of ibuprofen to control back pain.  He does not take any home medications.  He smokes cigarettes and drinks alcohol on a daily basis. He was admitted and taken to the cath lab for emergent cardiac cath.   Hospital Course      1. Inferior STEMI c/b cardiogenic shock: hs[place] [address]. Underwent cardiac cath noted above with PCI/DES to the RCA. Required pressor support while in the cath lab and brief period of time afterwards. Follow up EKG showed improvement of 3-4 mm inferior ST elevation. Placed on DAPT with ASA/Brilinta for at least one year. His  Echo showed an EF of 50 to 55% without definitive wall motion abnormalities.  He was able to wean from pressor support and tolerated low-dose beta-blocker therapy as well as low-dose ACE/ARB inhibition. Worked well with cardiac rehab without recurrent chest pain.   2.  Atrial fibrillation: In the setting of inferior STEMI.  He was placed on  amiodarone for a brief period of time with conversion to SR. Amiodarone was stopped and started on low-dose losartan initially at 12.5 mg twice a day prior to discharge.  3.  Hyperlipidemia: LDL cholesterol 144, lipid panel consistent with a mixed hyperlipidemic profile.   -- started on Atorvastatin 80 mg daily -- FLP/LFTs in 8 weeks  4.  Longstanding tobacco abuse: Patient has been smoking for greater than 45 years. -- Smoking cessation strongly advised.  5.  Acute renal insufficiency: Initial creatinine 1.38 now improved to 0.9 prior to discharge.  Did the patient have an acute coronary syndrome (MI, NSTEMI, STEMI, etc) this admission?:  Yes                               AHA/ACC Clinical Performance & Quality Measures: 1. Aspirin prescribed? - Yes 2. ADP Receptor Inhibitor (Plavix/Clopidogrel, Brilinta/Ticagrelor or Effient/Prasugrel) prescribed (includes medically managed patients)? - Yes 3. Beta Blocker prescribed? - Yes 4. High Intensity Statin (Lipitor 40-80mg  or Crestor 20-40mg ) prescribed? - Yes 5. EF assessed during THIS hospitalization? - Yes 6. For EF <40%, was ACEI/ARB prescribed? - Not Applicable (EF >/= 40%) 7. For EF <40%, Aldosterone Antagonist (Spironolactone or Eplerenone) prescribed? - Not Applicable (EF >/= 40%) 8. Cardiac Rehab Phase II ordered (including medically managed patients)? - Yes   _____________  Discharge Vitals Blood pressure 109/67, pulse 75, temperature 97.6 F (36.4 C), temperature source Oral, resp. rate 16, height 5\' 10"  (1.778 m), weight 66.8 kg, SpO2 96 %.  Filed Weights   07/02/20 0645 07/02/20 0650 07/04/20 0458  Weight: 68 kg 68 kg 66.8 kg    Labs & Radiologic Studies    CBC Recent Labs    07/02/20 0652 07/02/20 0710 07/02/20 1211 07/03/20 0430  WBC 11.8*   < > 11.7* 9.7  NEUTROABS 7.3  --   --   --   HGB 15.6   < > 14.3 13.5  HCT 47.9   < > 41.9 39.9  MCV 101.7*   < > 98.4 98.5  PLT 216   < > 217 172   < > = values in this  interval not displayed.   Basic Metabolic Panel Recent Labs    13/08/21 0652 07/02/20 0652 07/02/20 0710 07/02/20 0710 07/02/20 1211 07/03/20 0430  NA 139   < > 139  --   --  137  K 4.1   < > 3.9  --   --  3.7  CL 106   < > 109  --   --  110  CO2 20*  --   --   --   --  20*  GLUCOSE 145*   < > 141*  --   --  109*  BUN 12   < > 13  --   --  12  CREATININE 1.38*   < > 1.20   < > 1.19 0.96  CALCIUM 8.6*  --   --   --   --  8.4*   < > = values in this interval not displayed.   Liver Function Tests Recent Labs  07/02/20 0652  AST 29  ALT 21  ALKPHOS 95  BILITOT 0.6  PROT 5.8*  ALBUMIN 3.2*   No results for input(s): LIPASE, AMYLASE in the last 72 hours. High Sensitivity Troponin:   Recent Labs  Lab 07/02/20 0652 07/02/20 1211  TROPONINIHS 446* 25,012*    BNP Invalid input(s): POCBNP D-Dimer No results for input(s): DDIMER in the last 72 hours. Hemoglobin A1C Recent Labs    07/02/20 0652  HGBA1C 5.4   Fasting Lipid Panel Recent Labs    07/03/20 0430  CHOL 209*  HDL 26*  LDLCALC 144*  TRIG 193*  CHOLHDL 8.0   Thyroid Function Tests No results for input(s): TSH, T4TOTAL, T3FREE, THYROIDAB in the last 72 hours.  Invalid input(s): FREET3 _____________  CARDIAC CATHETERIZATION  Result Date: 07/02/2020 1.  Total occlusion of the proximal RCA, treated successfully with primary PCI using a 3.5 x 38 mm resolute Onyx DES 2.  Moderate ostial and proximal LAD stenoses, 50 to 70% range luminal narrowing 3.  Patent left main and left circumflex with minimal luminal irregularities 4.  Normal LV systolic function with normal LVEDP 5.  Cardiogenic shock treated with vasopressor therapy and reperfusion therapy Recommendations: ICU care, dual antiplatelet therapy with aspirin and ticagrelor at least 12 months (ticagrelor 180 mg administered orally at the end of the procedure), tirofiban x6 hours, low-dose norepinephrine infusion at completion of procedure titrate to MAP  greater than 65 or SBP greater than 90, IV amiodarone for treatment of atrial fibrillation  DG Chest Port 1 View  Result Date: 07/02/2020 CLINICAL DATA:  Chest pain, history coronary artery disease post STEMI and coronary stenting EXAM: PORTABLE CHEST 1 VIEW COMPARISON:  Portable exam 0902 hours without priors for comparison FINDINGS: Normal heart size, mediastinal contours, and pulmonary vascularity. Peribronchial thickening with minimal subsegmental atelectasis at LEFT base. No acute infiltrate, pleural effusion, or pneumothorax. Osseous structures unremarkable. IMPRESSION: Bronchitic changes with minimal subsegmental atelectasis at LEFT base. Electronically Signed   By: Ulyses Southward M.D.   On: 07/02/2020 10:03   ECHOCARDIOGRAM COMPLETE  Result Date: 07/02/2020    ECHOCARDIOGRAM REPORT   Patient Name:   Eddie Joseph Date of Exam: 07/02/2020 Medical Rec #:  [id number]       Height:       70.0 in Accession #:    [id number]      Weight:       150.0 lb Date of Birth:  [birth year]       BSA:          1.847 m Patient Age:    64 years        BP:           104/62 mmHg Patient Gender: M               HR:           67 bpm. Exam Location:  Inpatient Procedure: 2D Echo, Cardiac Doppler, Color Doppler and Intracardiac            Opacification Agent Indications:    Chest Pain 786.50 / R07.9  History:        Patient has no prior history of Echocardiogram examinations.                 Arrythmias:STEMI and Atrial Fibrillation; Risk Factors:Current                 Smoker.  Sonographer:    Renella Cunas RDCS Referring Phys: [id number]  Sonographer Comments: Image acquisition challenging due to respiratory motion. IMPRESSIONS  1. Left ventricular ejection fraction, by estimation, is 50 to 55%. The left ventricle has normal function. The left ventricle has no regional wall motion abnormalities. Left ventricular diastolic parameters are indeterminate.  2. Right ventricular systolic function is normal. The right  ventricular size is normal.  3. The mitral valve is normal in structure. No evidence of mitral valve regurgitation. No evidence of mitral stenosis.  4. The aortic valve was not well visualized. Aortic valve regurgitation is not visualized. No aortic stenosis is present.  5. The inferior vena cava is normal in size with greater than 50% respiratory variability, suggesting right atrial pressure of 3 mmHg. FINDINGS  Left Ventricle: Left ventricular ejection fraction, by estimation, is 50 to 55%. The left ventricle has normal function. The left ventricle has no regional wall motion abnormalities. Definity contrast agent was given IV to delineate the left ventricular  endocardial borders. The left ventricular internal cavity size was normal in size. There is no left ventricular hypertrophy. Left ventricular diastolic parameters are indeterminate. Right Ventricle: The right ventricular size is normal. No increase in right ventricular wall thickness. Right ventricular systolic function is normal. Left Atrium: Left atrial size was normal in size. Right Atrium: Right atrial size was normal in size. Pericardium: There is no evidence of pericardial effusion. Mitral Valve: The mitral valve is normal in structure. No evidence of mitral valve regurgitation. No evidence of mitral valve stenosis. Tricuspid Valve: The tricuspid valve is normal in structure. Tricuspid valve regurgitation is not demonstrated. No evidence of tricuspid stenosis. Aortic Valve: The aortic valve was not well visualized. Aortic valve regurgitation is not visualized. No aortic stenosis is present. Aortic valve mean gradient measures 2.5 mmHg. Aortic valve peak gradient measures 6.1 mmHg. Aortic valve area, by VTI measures 1.96 cm. Pulmonic Valve: The pulmonic valve was not well visualized. Pulmonic valve regurgitation is not visualized. No evidence of pulmonic stenosis. Aorta: The aortic root is normal in size and structure. Venous: The inferior vena cava  is normal in size with greater than 50% respiratory variability, suggesting right atrial pressure of 3 mmHg. IAS/Shunts: No atrial level shunt detected by color flow Doppler.  LEFT VENTRICLE PLAX 2D LVIDd:         4.00 cm      Diastology LVIDs:         3.30 cm      LV e' medial:    6.73 cm/s LV PW:         0.80 cm      LV E/e' medial:  13.6 LV IVS:        0.80 cm      LV e' lateral:   6.73 cm/s LVOT diam:     1.80 cm      LV E/e' lateral: 13.6 LV SV:         35 LV SV Index:   19 LVOT Area:     2.54 cm  LV Volumes (MOD) LV vol d, MOD A2C: 95.7 ml LV vol d, MOD A4C: 132.0 ml LV vol s, MOD A2C: 45.9 ml LV vol s, MOD A4C: 81.5 ml LV SV MOD A2C:     49.8 ml LV SV MOD A4C:     132.0 ml LV SV MOD BP:      52.8 ml RIGHT VENTRICLE RV S prime:     9.32 cm/s TAPSE (M-mode): 1.6 cm LEFT ATRIUM  Index       RIGHT ATRIUM           Index LA diam:        3.40 cm 1.84 cm/m  RA Area:     14.00 cm LA Vol (A2C):   51.0 ml 27.61 ml/m RA Volume:   36.40 ml  19.71 ml/m LA Vol (A4C):   41.3 ml 22.36 ml/m LA Biplane Vol: 47.2 ml 25.55 ml/m  AORTIC VALVE AV Area (Vmax):    1.77 cm AV Area (Vmean):   1.55 cm AV Area (VTI):     1.96 cm AV Vmax:           123.06 cm/s AV Vmean:          73.048 cm/s AV VTI:            0.180 m AV Peak Grad:      6.1 mmHg AV Mean Grad:      2.5 mmHg LVOT Vmax:         85.60 cm/s LVOT Vmean:        44.500 cm/s LVOT VTI:          0.139 m LVOT/AV VTI ratio: 0.77  AORTA Ao Root diam: 3.50 cm MITRAL VALVE MV Area (PHT): 4.06 cm    SHUNTS MV Decel Time: 187 msec    Systemic VTI:  0.14 m MV E velocity: 91.70 cm/s  Systemic Diam: 1.80 cm MV A velocity: 78.40 cm/s MV E/A ratio:  1.17 Jonathan Branch MD Electronically Dina Richna Rich MD Signature Date/Time: 07/02/2020/3:19:08 PM    Final    Disposition   Pt is being discharged home today in good condition.  Follow-up Plans & Appointments     Follow-up Information    [facility]. Go on 07/13/2020.   Why:  You are set up with a PCP appointment for hospital follow up at [facility] on Thursday, 07/13/2020 at 2:30 pm. Contact information: [address] [place] [address] [phone]       Rosalio Macadamia, NP Follow up on 07/25/2020.   Specialties: Nurse Practitioner, Interventional Cardiology, Cardiology, Radiology Why: at 11:15am for your follow up appt.  Contact information: [address] [place] [address] [phone]              Discharge Instructions    Amb Referral to Cardiac Rehabilitation   Complete by: As directed    Diagnosis:  Coronary Stents STEMI     After initial evaluation and assessments completed: Virtual Based Care may be provided alone or in conjunction with Phase 2 Cardiac Rehab based on patient barriers.: Yes   Diet - low sodium heart healthy   Complete by: As directed    Discharge instructions   Complete by: As directed    Radial Site Care Refer to this sheet in the next few weeks. These instructions provide you with information on caring for yourself after your procedure. Your caregiver may also give you more specific instructions. Your treatment has been planned according to current medical practices, but problems sometimes occur. Call your caregiver if you have any problems or questions after your procedure. HOME CARE INSTRUCTIONS You may shower the day after the procedure.Remove the bandage (dressing) and gently wash the site with plain soap and water.Gently pat the site dry.  Do not apply powder or lotion to the site.  Do not submerge the affected site in water for 3 to 5 days.  Inspect the site at least twice daily.  Do not flex or bend the affected arm for 24 hours.  No lifting over 5 pounds (2.3 kg) for 5 days after your procedure.  Do not drive home if you are discharged the same day of the procedure. Have someone else drive you.  You may drive 24 hours after the procedure unless otherwise  instructed by your caregiver.  What to expect: Any bruising will usually fade within 1 to 2 weeks.  Blood that collects in the tissue (hematoma) may be painful to the touch. It should usually decrease in size and tenderness within 1 to 2 weeks.  SEEK IMMEDIATE MEDICAL CARE IF: You have unusual pain at the radial site.  You have redness, warmth, swelling, or pain at the radial site.  You have drainage (other than a small amount of blood on the dressing).  You have chills.  You have a fever or persistent symptoms for more than 72 hours.  You have a fever and your symptoms suddenly get worse.  Your arm becomes pale, cool, tingly, or numb.  You have heavy bleeding from the site. Hold pressure on the site.   PLEASE DO NOT MISS ANY DOSES OF YOUR BRILINTA!!!!! Also keep a log of you blood pressures and bring back to your follow up appt. Please call the office with any questions.   Patients taking blood thinners should generally stay away from medicines like ibuprofen, Advil, Motrin, naproxen, and Aleve due to risk of stomach bleeding. You may take Tylenol as directed or talk to your primary doctor about alternatives.   Increase activity slowly   Complete by: As directed    No wound care   Complete by: As directed       Discharge Medications   Allergies as of 07/04/2020   No Known Allergies     Medication List    STOP taking these medications   amoxicillin 500 MG capsule Commonly known as: AMOXIL   HYDROcodone-acetaminophen 5-325 MG tablet Commonly known as: NORCO/VICODIN   ibuprofen 200 MG tablet Commonly known as: ADVIL     TAKE these medications   aspirin 81 MG EC tablet Take 1 tablet (81 mg total) by mouth daily. Swallow whole.   atorvastatin 80 MG tablet Commonly known as: LIPITOR Take 1 tablet (80 mg total) by mouth daily.   losartan 25 MG tablet Commonly known as: COZAAR Take 0.5 tablets (12.5 mg total) by mouth daily.   metoprolol tartrate 25 MG tablet Commonly  known as: LOPRESSOR Take 0.5 tablets (12.5 mg total) by mouth 2 (two) times daily.   nitroGLYCERIN 0.4 MG SL tablet Commonly known as: NITROSTAT Place 1 tablet (0.4 mg total) under the tongue every 5 (five) minutes x 3 doses as needed for chest pain.   ticagrelor 90 MG Tabs tablet Commonly known as: BRILINTA Take 1 tablet (90 mg total) by mouth 2 (two) times daily.          Outstanding Labs/Studies   FLP/LFTs in 8 weeks.   Duration of Discharge Encounter   Greater than 30 minutes including physician time.  Signed, Laverda PageLindsay Roberts, NP 07/04/2020, 11:19 AM    Patient seen and examined. Agree with assessment and plan. Day 2 status post inferior STEMI treated with successful stenting to his RCA. Chest pain completely resolved. Echo Doppler study showed EF at 50 to 55% without definitive wall motion abnormality. He is maintaining sinus rhythm, no longer on amiodarone and tolerating low-dose beta-blocker therapy as well as  low-dose ARB. He is walking with cardiac rehab without recurrent chest pain symptomatology. He is now on atorvastatin for hyperlipidemia with initial LDL of 144. Smoking cessation was discussed in detail. Renal function has improved with hydration. Plan discharge today with follow-up with Dr. Excell Seltzer.   Lennette Bihari, MD, [facility] 07/04/2020 11:19 AM

## 2020-07-04 NOTE — Discharge Instructions (Signed)
Information about your medication: Brilinta (anti-platelet agent)  Generic Name (Brand): ticagrelor (Brilinta), twice daily medication  PURPOSE: You are taking this medication along with aspirin to lower your chance of having a heart attack, stroke, or blood clots in your heart stent. These can be fatal. Brilinta and aspirin help prevent platelets from sticking together and forming a clot that can block an artery or your stent.   Common SIDE EFFECTS you may experience include: bruising or bleeding more easily, shortness of breath  Do not stop taking BRILINTA without talking to the doctor who prescribes it for you. People who are treated with a stent and stop taking Brilinta too soon, have a higher risk of getting a blood clot in the stent, having a heart attack, or dying. If you stop Brilinta because of bleeding, or for other reasons, your risk of a heart attack or stroke may increase.   Avoid taking NSAID agents or anti-inflammatory medications such as ibuprofen, naproxen given increased bleed risk with plavix - can use acetaminophen (Tylenol) if needed for pain.  Tell all of your doctors and dentists that you are taking Brilinta. They should talk to the doctor who prescribed Brilinta for you before you have any surgery or invasive procedure.   Contact your health care provider if you experience: severe or uncontrollable bleeding, pink/red/brown urine, vomiting blood or vomit that looks like "coffee grounds", red or black stools (looks like tar), coughing up blood or blood clots ----------------------------------------------------------------------------------------------------------------------  IF BRILINTA IS EXPENSIVE AFTER FIRST 30 DAYS, PLEASE CONTACT CARDIOLOGY OFFICE PRIOR TO RUNNING OUT AND WILL CHANGE FROM BRILINTA TO PLAVIX.

## 2020-07-10 ENCOUNTER — Telehealth (HOSPITAL_COMMUNITY): Payer: Self-pay

## 2020-07-10 NOTE — Telephone Encounter (Signed)
Called patient to see if he is interested in the Cardiac Rehab Program. Patient expressed interest. Explained scheduling process and went over insurance, patient verbalized understanding. Will contact patient for scheduling once f/u has been completed.  °

## 2020-07-10 NOTE — Telephone Encounter (Signed)
Pt is interested in participating in Virtual Cardiac and Pulmonary Rehab. Pt advised that Virtual Cardiac and Pulmonary Rehab is provided at no cost to the patient.  Checklist:  1. Pt has smart device  ie smartphone and/or ipad for downloading an app  Yes 2. Reliable internet/wifi service    Yes 3. Understands how to use their smartphone and navigate within an app.  Yes   Pt verbalized understanding and is in agreement.  

## 2020-07-11 NOTE — Progress Notes (Signed)
CARDIOLOGY OFFICE NOTE  Date:  07/25/2020    Eddie Joseph Date of Birth: [birth year] Medical Record #[id number]  PCP:  Patient, No Pcp Per  Cardiologist:  Excell Seltzer (NEW)  Chief Complaint  Patient presents with  . Follow-up    Seen for Dr. Excell Seltzer    History of Present Illness: Eddie Joseph is a 64 y.o. male who presents today for a post hospital visit. Seen for Dr. Excell Seltzer (NEW).   He has had no other PMH until earlier this month other than long standing tobacco abuse.   Presented earlier this month with chest pain - called 911 - was staying in local hotel - EKG with inferior STEMI with hypotension and bradycardia requiring Dopamine. Taken for emergent cath. Had PCI of the pRCA - on DAPT for one year. Looks to have had PAF - required IV amiodarone.   Comes in today. Here alone. Doing ok. He feels like he is doing well. He is staying at his son's house. He has had some occasional dizziness if he gets up too quick or turns his head too quick, otherwise, he says he is fine. He is pretty much back to all his usual activities. He has back issues over the past year which has limited his lifting. 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 He has cut back on his smoking - has not stopped. Down to a pack every 3 days now - was a pack a more. He has seen PCP recently as well. He has no real concerns. No problems with his groin/cath site.   Past Medical History:  Diagnosis Date  . Coronary artery disease   . Myocardial infarction [facility])     Past Surgical History:  Procedure Laterality Date  . CORONARY STENT INTERVENTION N/A 07/02/2020   Procedure: CORONARY STENT INTERVENTION;  Surgeon: Tonny Bollman, MD;  Location: [facility] INVASIVE CV LAB;  Service: Cardiovascular;  Laterality: N/A;  . CORONARY/GRAFT ACUTE MI REVASCULARIZATION N/A 07/02/2020   Procedure: Coronary/Graft Acute MI Revascularization;  Surgeon: Tonny Bollman, MD;  Location: [facility] INVASIVE CV LAB;  Service: Cardiovascular;  Laterality: N/A;    . LEFT HEART CATH AND CORONARY ANGIOGRAPHY N/A 07/02/2020   Procedure: LEFT HEART CATH AND CORONARY ANGIOGRAPHY;  Surgeon: Tonny Bollman, MD;  Location: [facility] INVASIVE CV LAB;  Service: Cardiovascular;  Laterality: N/A;     Medications: Current Meds  Medication Sig  . aspirin 81 MG EC tablet Take 1 tablet (81 mg total) by mouth daily. Swallow whole.  Marland Kitchen atorvastatin (LIPITOR) 80 MG tablet Take 1 tablet (80 mg total) by mouth daily.  Marland Kitchen losartan (COZAAR) 25 MG tablet Take 0.5 tablets (12.5 mg total) by mouth daily.  . metoprolol tartrate (LOPRESSOR) 25 MG tablet Take 0.5 tablets (12.5 mg total) by mouth 2 (two) times daily.  . nitroGLYCERIN (NITROSTAT) 0.4 MG SL tablet Place 1 tablet (0.4 mg total) under the tongue every 5 (five) minutes x 3 doses as needed for chest pain.  . ticagrelor (BRILINTA) 90 MG TABS tablet Take 1 tablet (90 mg total) by mouth 2 (two) times daily.     Allergies: No Known Allergies  Social History: The patient  reports that he has been smoking cigarettes. He has been smoking about 1.00 pack per day. He has never used smokeless tobacco. He reports current alcohol use of about 10.0 standard drinks of alcohol per week. He reports that he does not use drugs.   Family History: The patient's family history includes Cancer in his mother; Heart disease  in his mother; Heart disease (age of onset: 8) in his father.   Review of Systems: Please see the history of present illness.   All other systems are reviewed and negative.   Physical Exam: VS:  BP (!) 80/40   Pulse 71   Ht 5\' 11"  (1.803 m)   Wt 149 lb (67.6 kg)   SpO2 98%   BMI 20.78 kg/m  .  BMI Body mass index is 20.78 kg/m.  Wt Readings from Last 3 Encounters:  07/25/20 149 lb (67.6 kg)  07/18/20 148 lb 12.8 oz (67.5 kg)  07/04/20 147 lb 3.2 oz (66.8 kg)    General: Pleasant. Alert and in no acute distress.   Cardiac: Regular rate and rhythm. No murmurs, rubs, or gallops. No edema.  Respiratory:  Lungs  are coarse bilaterally with normal work of breathing.  GI: Soft and nontender.  MS: No deformity or atrophy. Gait and ROM intact.  Skin: Warm and dry. Color is normal.  Neuro:  Strength and sensation are intact and no gross focal deficits noted.  Psych: Alert, appropriate and with normal affect.   LABORATORY DATA:  EKG:  EKG is not ordered today.    Lab Results  Component Value Date   WBC 9.7 07/03/2020   HGB 13.5 07/03/2020   HCT 39.9 07/03/2020   PLT 172 07/03/2020   GLUCOSE 109 (H) 07/03/2020   CHOL 209 (H) 07/03/2020   TRIG 193 (H) 07/03/2020   HDL 26 (L) 07/03/2020   LDLCALC 144 (H) 07/03/2020   ALT 21 07/02/2020   AST 29 07/02/2020   NA 137 07/03/2020   K 3.7 07/03/2020   CL 110 07/03/2020   CREATININE 0.96 07/03/2020   BUN 12 07/03/2020   CO2 20 (L) 07/03/2020   INR 1.0 07/02/2020   HGBA1C 5.4 07/02/2020     BNP (last 3 results) No results for input(s): BNP in the last 8760 hours.  ProBNP (last 3 results) No results for input(s): PROBNP in the last 8760 hours.   Other Studies Reviewed Today:  Cath: 07/02/20  1. Total occlusion of the proximal RCA, treated successfully with primary PCI using a 3.5 x 38 mm resolute Onyx DES 2. Moderate ostial and proximal LAD stenoses, 50 to 70% range luminal narrowing 3. Patent left main and left circumflex with minimal luminal irregularities 4. Normal LV systolic function with normal LVEDP 5. Cardiogenic shock treated with vasopressor therapy and reperfusion therapy  Recommendations: ICU care, dual antiplatelet therapy with aspirin and ticagrelor at least 12 months (ticagrelor 180 mg administered orally at the end of the procedure), tirofiban x6 hours, low-dose norepinephrine infusion at completion of procedure titrate to MAP greater than 65 or SBP greater than 90, IV amiodarone for treatment of atrial fibrillation  Diagnostic Dominance: Right  Intervention   Echo: 07/02/20  IMPRESSIONS    1. Left  ventricular ejection fraction, by estimation, is 50 to 55%. The  left ventricle has normal function. The left ventricle has no regional  wall motion abnormalities. Left ventricular diastolic parameters are  indeterminate.  2. Right ventricular systolic function is normal. The right ventricular  size is normal.  3. The mitral valve is normal in structure. No evidence of mitral valve  regurgitation. No evidence of mitral stenosis.  4. The aortic valve was not well visualized. Aortic valve regurgitation  is not visualized. No aortic stenosis is present.  5. The inferior vena cava is normal in size with greater than 50%  respiratory variability, suggesting right  atrial pressure of 3 mmHg.  _____________   Assessment/Plan:  1. Inferior STEMI with cardiogenic shock - s/p PCI/DES to RCA - has residual disease as well - BP low - he is basically not symptomatic and I have elected to leave him on his current regimen with low dose beta blocker and low dose ARB. He remains on DAPT. On statin. Smoking continues.   2. HLD - on high dose statin - to have repeat lab in 3 months at [facility].   3. Long standing tobacco abuse - I do not get the feeling that he is going to be able to stop completely - he has cut back. Total cessation encouraged.   4. PAF - in the setting of his MI - remains on low dose beta blocker - in sinus by exam.   Current medicines are reviewed with the patient today.  The patient does not have concerns regarding medicines other than what has been noted above.  The following changes have been made:  See above.  Labs/ tests ordered today include:    Orders Placed This Encounter  Procedures  . Basic metabolic panel  . CBC     Disposition:   FU with Tereso Newcomer, PA with Dr. Excell Seltzer in 6 weeks. Baseline lab today. No change with current regimen.    Patient is agreeable to this plan and will call if any problems develop in the interim.   SignedNorma Fredrickson,  NP  07/25/2020 11:30 AM  [facility] [address] [place], [place]  [address] Phone: [phone] Fax: [phone]

## 2020-07-13 ENCOUNTER — Inpatient Hospital Stay: Payer: Self-pay | Admitting: Physician Assistant

## 2020-07-18 ENCOUNTER — Ambulatory Visit: Payer: MEDICAID | Attending: Physician Assistant | Admitting: Internal Medicine

## 2020-07-18 ENCOUNTER — Other Ambulatory Visit: Payer: Self-pay | Admitting: Internal Medicine

## 2020-07-18 ENCOUNTER — Other Ambulatory Visit: Payer: Self-pay

## 2020-07-18 ENCOUNTER — Encounter: Payer: Self-pay | Admitting: Internal Medicine

## 2020-07-18 DIAGNOSIS — I2510 Atherosclerotic heart disease of native coronary artery without angina pectoris: Secondary | ICD-10-CM

## 2020-07-18 DIAGNOSIS — Z2821 Immunization not carried out because of patient refusal: Secondary | ICD-10-CM | POA: Insufficient documentation

## 2020-07-18 MED ORDER — TICAGRELOR 90 MG PO TABS
90.0000 mg | ORAL_TABLET | Freq: Two times a day (BID) | ORAL | 3 refills | Status: DC
Start: 2020-07-18 — End: 2020-07-18

## 2020-07-18 MED ORDER — METOPROLOL TARTRATE 25 MG PO TABS
12.5000 mg | ORAL_TABLET | Freq: Two times a day (BID) | ORAL | 3 refills | Status: DC
Start: 2020-07-18 — End: 2020-07-18

## 2020-07-18 MED ORDER — LOSARTAN POTASSIUM 25 MG PO TABS
12.5000 mg | ORAL_TABLET | Freq: Every day | ORAL | 3 refills | Status: DC
Start: 2020-07-18 — End: 2021-03-01

## 2020-07-18 MED ORDER — ATORVASTATIN CALCIUM 80 MG PO TABS
80.0000 mg | ORAL_TABLET | Freq: Every day | ORAL | 3 refills | Status: DC
Start: 2020-07-18 — End: 2021-03-19

## 2020-07-18 NOTE — Progress Notes (Signed)
Post hospital f/u STEMI 11/21- see A/p note for details  He is feeling well. Has no problems.  He is back to full activities. He has no complaints at access site.  S/p PCI.   He has had meds filled at hospital and will need refills.  He continues to smoke but only one cig/day  Past Medical History:  Diagnosis Date  . Coronary artery disease   . Myocardial infarction [facility])     Social History   Socioeconomic History  . Marital status: Single    Spouse name: Not on file  . Number of children: Not on file  . Years of education: Not on file  . Highest education level: Not on file  Occupational History  . Not on file  Tobacco Use  . Smoking status: Current Every Day Smoker    Packs/day: 1.00    Types: Cigarettes  . Smokeless tobacco: Never Used  Vaping Use  . Vaping Use: Never used  Substance and Sexual Activity  . Alcohol use: Yes    Alcohol/week: 10.0 standard drinks    Types: 10 Cans of beer per week  . Drug use: No  . Sexual activity: Not on file  Other Topics Concern  . Not on file  Social History Narrative  . Not on file   Social Determinants of Health   Financial Resource Strain:   . Difficulty of Paying Living Expenses: Not on file  Food Insecurity:   . Worried About Programme researcher, broadcasting/film/video in the Last Year: Not on file  . Ran Out of Food in the Last Year: Not on file  Transportation Needs:   . Lack of Transportation (Medical): Not on file  . Lack of Transportation (Non-Medical): Not on file  Physical Activity:   . Days of Exercise per Week: Not on file  . Minutes of Exercise per Session: Not on file  Stress:   . Feeling of Stress : Not on file  Social Connections:   . Frequency of Communication with Friends and Family: Not on file  . Frequency of Social Gatherings with Friends and Family: Not on file  . Attends Religious Services: Not on file  . Active Member of Clubs or Organizations: Not on file  . Attends Banker Meetings: Not on file  .  Marital Status: Not on file  Intimate Partner Violence:   . Fear of Current or Ex-Partner: Not on file  . Emotionally Abused: Not on file  . Physically Abused: Not on file  . Sexually Abused: Not on file    Past Surgical History:  Procedure Laterality Date  . CORONARY STENT INTERVENTION N/A 07/02/2020   Procedure: CORONARY STENT INTERVENTION;  Surgeon: Tonny Bollman, MD;  Location: [facility] INVASIVE CV LAB;  Service: Cardiovascular;  Laterality: N/A;  . CORONARY/GRAFT ACUTE MI REVASCULARIZATION N/A 07/02/2020   Procedure: Coronary/Graft Acute MI Revascularization;  Surgeon: Tonny Bollman, MD;  Location: [facility] INVASIVE CV LAB;  Service: Cardiovascular;  Laterality: N/A;  . LEFT HEART CATH AND CORONARY ANGIOGRAPHY N/A 07/02/2020   Procedure: LEFT HEART CATH AND CORONARY ANGIOGRAPHY;  Surgeon: Tonny Bollman, MD;  Location: [facility] INVASIVE CV LAB;  Service: Cardiovascular;  Laterality: N/A;    Family History  Problem Relation Age of Onset  . Cancer Mother        breast ca  . Heart disease Mother        MI at 79, deceased  . Heart disease Father 67       complication of PNS-  deceased age 66    No Known Allergies  Current Outpatient Medications on File Prior to Visit  Medication Sig Dispense Refill  . aspirin 81 MG EC tablet Take 1 tablet (81 mg total) by mouth daily. Swallow whole. 90 tablet 3  . nitroGLYCERIN (NITROSTAT) 0.4 MG SL tablet Place 1 tablet (0.4 mg total) under the tongue every 5 (five) minutes x 3 doses as needed for chest pain. 25 tablet 3   No current facility-administered medications on file prior to visit.     patient denies chest pain, shortness of breath, orthopnea. Denies lower extremity edema, abdominal pain, change in appetite, change in bowel movements. Patient denies rashes, musculoskeletal complaints. No other specific complaints in a complete review of systems.   BP 106/70   Pulse 63   Resp 16   Ht 5\' 11"  (1.803 m)   Wt 148 lb 12.8 oz (67.5 kg)   SpO2 98%    BMI 20.75 kg/m   well-developed well-nourished male in no acute distress. HEENT exam atraumatic, normocephalic, neck supple without jugular venous distention. Chest clear to auscultation cardiac exam S1-S2 are regular. Abdominal exam  with bowel sounds, soft and nontender. Extremities no edema. Neurologic exam is alert with a normal gait.  Coronary artery disease S/p MI He has no sxs Continue current meds (refilled today) F/u 3 months He will need labs at that time (lipids).   He will call for any sxs: CP, SOB, etc.  Advised absolute smoking abstinence- he voices understanidng.  I have offered medical assistance if he cannot stop on his own.

## 2020-07-18 NOTE — Assessment & Plan Note (Signed)
S/p MI He has no sxs Continue current meds (refilled today) F/u 3 months He will need labs at that time (lipids).   He will call for any sxs: CP, SOB, etc.  Advised absolute smoking abstinence- he voices understanidng.  I have offered medical assistance if he cannot stop on his own.

## 2020-07-25 ENCOUNTER — Ambulatory Visit (INDEPENDENT_AMBULATORY_CARE_PROVIDER_SITE_OTHER): Payer: Self-pay | Admitting: Nurse Practitioner

## 2020-07-25 ENCOUNTER — Other Ambulatory Visit: Payer: Self-pay

## 2020-07-25 ENCOUNTER — Encounter: Payer: Self-pay | Admitting: Nurse Practitioner

## 2020-07-25 VITALS — BP 80/40 | HR 71 | Ht 71.0 in | Wt 149.0 lb

## 2020-07-25 DIAGNOSIS — E782 Mixed hyperlipidemia: Secondary | ICD-10-CM

## 2020-07-25 DIAGNOSIS — I2111 ST elevation (STEMI) myocardial infarction involving right coronary artery: Secondary | ICD-10-CM

## 2020-07-25 DIAGNOSIS — I259 Chronic ischemic heart disease, unspecified: Secondary | ICD-10-CM

## 2020-07-25 DIAGNOSIS — R570 Cardiogenic shock: Secondary | ICD-10-CM

## 2020-07-25 LAB — BASIC METABOLIC PANEL
BUN/Creatinine Ratio: 13 (ref 10–24)
BUN: 14 mg/dL (ref 8–27)
CO2: 25 mmol/L (ref 20–29)
Calcium: 8.9 mg/dL (ref 8.6–10.2)
Chloride: 103 mmol/L (ref 96–106)
Creatinine, Ser: 1.12 mg/dL (ref 0.76–1.27)
GFR calc Af Amer: 80 mL/min/{1.73_m2} (ref >59)
GFR calc non Af Amer: 70 mL/min/{1.73_m2} (ref >59)
Glucose: 81 mg/dL (ref 65–99)
Potassium: 4.4 mmol/L (ref 3.5–5.2)
Sodium: 138 mmol/L (ref 134–144)

## 2020-07-25 LAB — CBC
Hematocrit: 36.3 % — ABNORMAL LOW (ref 37.5–51.0)
Hemoglobin: 12.6 g/dL — ABNORMAL LOW (ref 13.0–17.7)
MCH: 33 pg (ref 26.6–33.0)
MCHC: 34.7 g/dL (ref 31.5–35.7)
MCV: 95 fL (ref 79–97)
Platelets: 244 10*3/uL (ref 150–450)
RBC: 3.82 x10E6/uL — ABNORMAL LOW (ref 4.14–5.80)
RDW: 11.9 % (ref 11.6–15.4)
WBC: 7.6 10*3/uL (ref 3.4–10.8)

## 2020-07-25 NOTE — Patient Instructions (Addendum)
After Visit Summary:  We will be checking the following labs today - BMET and CBC   Medication Instructions:    Continue with your current medicines.    If you need a refill on your cardiac medications before your next appointment, please call your pharmacy.     Testing/Procedures To Be Arranged:  N/A  Follow-Up:   See Eddie Newcomer, PA with Dr. Excell Seltzer in about 6 weeks.     At [facility], you and your health needs are our priority.  As part of our continuing mission to provide you with exceptional heart care, we have created designated Provider Care Teams.  These Care Teams include your primary Cardiologist (physician) and Advanced Practice Providers (APPs -  Physician Assistants and Nurse Practitioners) who all work together to provide you with the care you need, when you need it.  Special Instructions:  . Stay safe, wash your hands for at least 20 seconds and wear a mask when needed.  . It was good to talk with you today.    Call the [facility] office at [phone] if you have any questions, problems or concerns.

## 2020-07-27 NOTE — Progress Notes (Signed)
Pt has been made aware of normal result and verbalized understanding.  jw

## 2020-08-01 MED FILL — BRILINTA 90 MG TABLET: 90 | 30 days supply | Qty: 60 | Fill #0

## 2020-08-01 MED FILL — ATORVASTATIN CALCIUM 80 MG: 80 | 30 days supply | Qty: 30 | Fill #0

## 2020-08-01 MED FILL — METOPROLOL TARTRATE 25 MG T: 25 | 30 days supply | Qty: 30 | Fill #0

## 2020-08-01 MED FILL — LOSARTAN POTASSIUM 25 MG TA: 25 | 30 days supply | Qty: 15 | Fill #0

## 2020-08-08 ENCOUNTER — Telehealth (HOSPITAL_COMMUNITY): Payer: Self-pay

## 2020-08-08 NOTE — Telephone Encounter (Signed)
Called and spoke with pt in regards to CR, pt stated he is not interested at this time.   Closed referral 

## 2020-08-28 NOTE — Progress Notes (Signed)
Cardiology Office Note:    Date:  08/29/2020   ID:  Eddie Joseph, DOB Nov 12, 1955, MRN [id number]  PCP:  Patient, No Pcp Per  [facility] Cardiologist:  Tonny Bollman, MD   [facility] Electrophysiologist:  None   Referring MD: No ref. provider found   Chief Complaint:  Follow-up (CAD)    Patient Profile:    Eddie Joseph is a 65 y.o. 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  He returns for f/u.  He is doing well.  He denies chest pain, shortness of breath, syncope, orthopnea, PND or significant pedal edema.       Past Medical History:  Diagnosis Date  . Coronary artery disease   . Myocardial infarction [facility])     Current Medications: Current Meds  Medication Sig  . aspirin 81 MG EC tablet Take 1 tablet (81 mg total) by mouth daily. Swallow whole.  Marland Kitchen atorvastatin (LIPITOR) 80 MG tablet Take 1 tablet (80 mg total) by mouth daily.  Marland Kitchen losartan (COZAAR) 25 MG tablet Take 0.5 tablets (12.5 mg total) by mouth daily.  . metoprolol tartrate (LOPRESSOR) 25 MG tablet Take 0.5 tablets (12.5 mg total) by mouth 2 (two) times daily.  . nitroGLYCERIN (NITROSTAT) 0.4 MG SL tablet Place 1 tablet (0.4 mg total) under the tongue every 5 (five) minutes x 3 doses as needed for chest pain.  . ticagrelor (BRILINTA) 90 MG TABS tablet Take 1 tablet (90 mg total) by mouth 2 (two) times daily.     Allergies:   Patient has no  known allergies.   Social History   Tobacco Use  . Smoking status: Current Every Day Smoker    Packs/day: 1.00    Types: Cigarettes  . Smokeless tobacco: Never Used  Vaping Use  . Vaping Use: Never used  Substance Use Topics  . Alcohol use: Yes    Alcohol/week: 10.0 standard drinks    Types: 10 Cans of beer per week  . Drug use: No     Family Hx: The patient's family history includes Cancer in his mother; Heart disease in his mother; Heart disease (age of onset: 37) in his father.  ROS   EKGs/Labs/Other Test Reviewed:    EKG:  EKG is   ordered today.  The ekg ordered today demonstrates normal sinus rhythm, heart rate 62, normal axis, inferior Q waves, increased artifact, no ST-T wave changes, QTC 412  Recent Labs: 07/02/2020: ALT 21 07/25/2020: BUN 14; Creatinine, Ser 1.12; Hemoglobin 12.6; Platelets 244; Potassium 4.4; Sodium 138   Recent Lipid Panel Lab Results  Component Value Date/Time   CHOL 209 (H) 07/03/2020 04:30 AM   TRIG 193 (H) 07/03/2020 04:30 AM   HDL 26 (L) 07/03/2020 04:30 AM   CHOLHDL 8.0 07/03/2020 04:30 AM   LDLCALC 144 (H) 07/03/2020 04:30 AM      Risk Assessment/Calculations:  Physical Exam:    VS:  BP 104/70   Pulse 72   Ht 5\' 11"  (1.803 m)   Wt 150 lb (68 kg)   SpO2 98%   BMI 20.92 kg/m     Wt Readings from Last 3 Encounters:  08/29/20 150 lb (68 kg)  07/25/20 149 lb (67.6 kg)  07/18/20 148 lb 12.8 oz (67.5 kg)     Constitutional:      Appearance: Healthy appearance. Not in distress.  Neck:     Vascular: No JVR. JVD normal.  Pulmonary:     Effort: Pulmonary effort is normal.     Breath sounds: No wheezing. No rales.  Cardiovascular:     Normal rate. Regular rhythm. Normal S1. Normal S2.     Murmurs: There is no murmur.  Edema:    Peripheral edema absent.  Abdominal:     Palpations: Abdomen is soft. There is no hepatomegaly.  Skin:    General: Skin is warm and dry.  Neurological:     General: No focal deficit  present.     Mental Status: Alert and oriented to person, place and time.     Cranial Nerves: Cranial nerves are intact.       ASSESSMENT & PLAN:    1. Coronary artery disease involving native coronary artery of native heart without angina pectoris History of inferior STEMI in November 2021 treated with DES to the RCA.  His EF was preserved by echocardiogram.  Of note, he does have residual disease in LAD with 50% ostial and 70% mid stenosis.  He is currently doing well without anginal symptoms.  We discussed the importance of dual antiplatelet therapy for the next 1 year.  He has been getting his medications [facility] at [facility] and [facility].  I have asked him to continue to refill his medicines there.  If he has difficulty affording his medications, especially his Ticagrelor, he should call [facility] right away.  In that instance, we would need to change his Ticagrelor to clopidogrel.  He declined cardiac rehabilitation.  He plans to continue walking on his own.  Continue aspirin, atorvastatin, losartan, metoprolol tartrate, ticagrelor.  Follow-up in 6 months.  2. Mixed hyperlipidemia Continue high intensity statin therapy.  Arrange fasting CMET, lipids.  3. Dizziness He has lightheadedness when he stands.  His blood pressure is somewhat low.  I have asked him to change his losartan to bedtime dosing.  If this does not improve his symptoms, we could consider changing his metoprolol tartrate to metoprolol succinate 12.5 mg daily.  Otherwise, we will need to stop his losartan if his symptoms continue.  He knows to contact me if there is no change with adjusting his losartan to bedtime dosing.  4. Tobacco abuse We discussed the importance of quitting smoking.     Dispo:  Return in about 6 months (around 02/26/2021) for Routine Follow Up, w/ Dr. 04/29/2021, in person.   Medication Adjustments/Labs and Tests Ordered: Current medicines are reviewed at length with the patient  today.  Concerns regarding medicines are outlined above.  Tests Ordered: Orders Placed This Encounter  Procedures  . Comprehensive metabolic panel  . Lipid panel  . EKG 12-Lead   Medication Changes: No orders of the defined types were placed in this encounter.   Signed, Excell Seltzer, PA-C  08/29/2020 5:20 PM    [facility] [address] [place], [place], [place]  [place] Phone: [phone]; Fax: [phone]

## 2020-08-29 ENCOUNTER — Other Ambulatory Visit: Payer: Self-pay

## 2020-08-29 ENCOUNTER — Ambulatory Visit (INDEPENDENT_AMBULATORY_CARE_PROVIDER_SITE_OTHER): Payer: Self-pay | Admitting: Physician Assistant

## 2020-08-29 ENCOUNTER — Encounter: Payer: Self-pay | Admitting: Physician Assistant

## 2020-08-29 VITALS — BP 104/70 | HR 72 | Ht 71.0 in | Wt 150.0 lb

## 2020-08-29 DIAGNOSIS — R42 Dizziness and giddiness: Secondary | ICD-10-CM

## 2020-08-29 DIAGNOSIS — E782 Mixed hyperlipidemia: Secondary | ICD-10-CM

## 2020-08-29 DIAGNOSIS — I2510 Atherosclerotic heart disease of native coronary artery without angina pectoris: Secondary | ICD-10-CM

## 2020-08-29 DIAGNOSIS — Z720 Tobacco use: Secondary | ICD-10-CM

## 2020-08-29 MED FILL — LOSARTAN POTASSIUM 25 MG TA: 25 | 30 days supply | Qty: 15 | Fill #1

## 2020-08-29 MED FILL — ATORVASTATIN CALCIUM 80 MG: 80 | 30 days supply | Qty: 30 | Fill #1

## 2020-08-29 MED FILL — BRILINTA 90 MG TABLET: 90 | 30 days supply | Qty: 60 | Fill #1

## 2020-08-29 MED FILL — METOPROLOL TARTRATE 25 MG T: 25 | 30 days supply | Qty: 30 | Fill #1

## 2020-08-29 NOTE — Patient Instructions (Addendum)
Medication Instructions:  Your physician recommends that you continue on your current medications as directed. Please refer to the Current Medication list given to you today.  *If you need a refill on your cardiac medications before your next appointment, please call your pharmacy*  Lab Work: Your physician recommends that you return for lab work in 2 weeks on 09/12/20. You may come anytime between 7:30AM-4:30PM. Make sure you come fasting, your cholesterol will be checked.  Testing/Procedures: None ordered today  Follow-Up: At [facility], you and your health needs are our priority.  As part of our continuing mission to provide you with exceptional heart care, we have created designated Provider Care Teams.  These Care Teams include your primary Cardiologist (physician) and Advanced Practice Providers (APPs -  Physician Assistants and Nurse Practitioners) who all work together to provide you with the care you need, when you need it.  Your next appointment:   6 month(s)  The format for your next appointment:   In Person  Provider:   Tonny Bollman, MD  Other Instructions Change your Losartan to bedtime, call if dizziness continues. Call if Brilinta is too expensive. Continue to refill medications at [facility].

## 2020-09-12 ENCOUNTER — Other Ambulatory Visit: Payer: Self-pay

## 2020-09-25 ENCOUNTER — Other Ambulatory Visit: Payer: Self-pay

## 2020-09-25 ENCOUNTER — Other Ambulatory Visit: Payer: Self-pay | Admitting: *Deleted

## 2020-09-25 DIAGNOSIS — E782 Mixed hyperlipidemia: Secondary | ICD-10-CM

## 2020-09-25 DIAGNOSIS — I2510 Atherosclerotic heart disease of native coronary artery without angina pectoris: Secondary | ICD-10-CM

## 2020-09-26 LAB — LIPID PANEL
Chol/HDL Ratio: 3.1 ratio (ref 0.0–5.0)
Cholesterol, Total: 124 mg/dL (ref 100–199)
HDL: 40 mg/dL (ref >39)
LDL Chol Calc (NIH): 65 mg/dL (ref 0–99)
Triglycerides: 102 mg/dL (ref 0–149)
VLDL Cholesterol Cal: 19 mg/dL (ref 5–40)

## 2020-09-26 LAB — COMPREHENSIVE METABOLIC PANEL
ALT: 21 IU/L (ref 0–44)
AST: 18 IU/L (ref 0–40)
Albumin/Globulin Ratio: 1.6 (ref 1.2–2.2)
Albumin: 4.2 g/dL (ref 3.8–4.8)
Alkaline Phosphatase: 149 IU/L — ABNORMAL HIGH (ref 44–121)
BUN/Creatinine Ratio: 6 — ABNORMAL LOW (ref 10–24)
BUN: 7 mg/dL — ABNORMAL LOW (ref 8–27)
Bilirubin Total: 0.3 mg/dL (ref 0.0–1.2)
CO2: 22 mmol/L (ref 20–29)
Calcium: 9.3 mg/dL (ref 8.6–10.2)
Chloride: 106 mmol/L (ref 96–106)
Creatinine, Ser: 1.17 mg/dL (ref 0.76–1.27)
GFR calc Af Amer: 76 mL/min/{1.73_m2} (ref >59)
GFR calc non Af Amer: 66 mL/min/{1.73_m2} (ref >59)
Globulin, Total: 2.7 g/dL (ref 1.5–4.5)
Glucose: 93 mg/dL (ref 65–99)
Potassium: 4.6 mmol/L (ref 3.5–5.2)
Sodium: 143 mmol/L (ref 134–144)
Total Protein: 6.9 g/dL (ref 6.0–8.5)

## 2020-09-27 ENCOUNTER — Telehealth: Payer: Self-pay

## 2020-09-27 DIAGNOSIS — Z79899 Other long term (current) drug therapy: Secondary | ICD-10-CM

## 2020-09-27 NOTE — Telephone Encounter (Signed)
Repeat LFTs in 8 weeks. If still elevated will refer to GI. Tereso Newcomer, PA-C    09/27/2020 4:50 PM

## 2020-09-27 NOTE — Telephone Encounter (Signed)
The patient has been notified of the result and verbalized understanding.  All questions (if any) were answered.  Patient states he does not have a PCP.    Leanord Hawking, RN 09/27/2020 4:46 PM

## 2020-09-29 MED FILL — BRILINTA 90 MG TABLET: 90 | 30 days supply | Qty: 60 | Fill #2

## 2020-09-29 MED FILL — METOPROLOL TARTRATE 25 MG T: 25 | 30 days supply | Qty: 30 | Fill #2

## 2020-09-29 MED FILL — ATORVASTATIN CALCIUM 80 MG: 80 | 30 days supply | Qty: 30 | Fill #2

## 2020-09-29 MED FILL — LOSARTAN POTASSIUM 25 MG TA: 25 | 30 days supply | Qty: 15 | Fill #2

## 2020-09-29 NOTE — Telephone Encounter (Signed)
Lab Appt made for Friday 4/1 for repeat LFT's per SW, PA

## 2020-10-24 ENCOUNTER — Telehealth: Payer: Self-pay | Admitting: Cardiovascular Disease

## 2020-10-24 NOTE — Telephone Encounter (Signed)
Pt. Came into the office on today. He stated that he was supposed to have a follow up with Dr. Excell Seltzer but it was never scheduled. He is requesting a call to set up an appointment with him for his 6 month f/u around July. He last saw Scott in [name]. There was only hold slots available so he could not be scheduled in the office.

## 2020-10-31 MED FILL — ATORVASTATIN CALCIUM 80 MG: 80 | 30 days supply | Qty: 30 | Fill #3

## 2020-10-31 MED FILL — METOPROLOL TARTRATE 25 MG T: 25 | 30 days supply | Qty: 30 | Fill #3

## 2020-10-31 MED FILL — BRILINTA 90 MG TABLET: 90 | 30 days supply | Qty: 60 | Fill #3

## 2020-10-31 MED FILL — LOSARTAN POTASSIUM 25 MG TA: 25 | 30 days supply | Qty: 15 | Fill #3

## 2020-11-01 NOTE — Telephone Encounter (Signed)
Scheduled the patient for visit with Dr. Excell Seltzer 03/19/21 at 0800. He was grateful for call and agrees with plan.

## 2020-11-24 ENCOUNTER — Other Ambulatory Visit: Payer: Self-pay

## 2020-11-24 ENCOUNTER — Encounter (INDEPENDENT_AMBULATORY_CARE_PROVIDER_SITE_OTHER): Payer: Self-pay

## 2020-11-24 ENCOUNTER — Other Ambulatory Visit: Payer: Self-pay | Admitting: *Deleted

## 2020-11-24 LAB — HEPATIC FUNCTION PANEL
ALT: 21 IU/L (ref 0–44)
AST: 21 IU/L (ref 0–40)
Albumin: 4 g/dL (ref 3.8–4.8)
Alkaline Phosphatase: 159 IU/L — ABNORMAL HIGH (ref 44–121)
Bilirubin Total: <0.2 mg/dL (ref 0.0–1.2)
Bilirubin, Direct: <0.1 mg/dL (ref 0.00–0.40)
Total Protein: 6.7 g/dL (ref 6.0–8.5)

## 2020-11-25 ENCOUNTER — Other Ambulatory Visit: Payer: Self-pay

## 2020-11-27 ENCOUNTER — Telehealth: Payer: Self-pay | Admitting: *Deleted

## 2020-11-27 DIAGNOSIS — R748 Abnormal levels of other serum enzymes: Secondary | ICD-10-CM

## 2020-11-27 NOTE — Telephone Encounter (Signed)
Pt has been notified of lab results by phone with verbal understanding. Pt aware referral to be placed to [facility] for further follow up on elevated ALP. Pt asked if his Brilinta could be causing ALP to increase. I assured the pt that the Brilinta is safe and not causing the increase in the ALP. Pt thanked me for the call and is aware [facility] will call him with an appt. Patient notified of result.  Please refer to phone note from today for complete details.   Danielle Rankin, CMA 11/27/2020 10:20 AM

## 2020-11-27 NOTE — Telephone Encounter (Signed)
-----  Message from Liliane Shi, PA-C sent at 11/27/2020  9:17 AM EDT ----- ALT normal.  Alkaline phosphatase elevated. PLAN:  -Continue current medications -Refer to GI for elevated alk phos  [name], PA-C    11/27/2020 9:15 AM

## 2020-12-01 ENCOUNTER — Other Ambulatory Visit: Payer: Self-pay

## 2020-12-01 MED FILL — Losartan Potassium Tab 25 MG: ORAL | 30 days supply | Qty: 15 | Fill #0 | Status: AC

## 2020-12-01 MED FILL — Ticagrelor Tab 90 MG: ORAL | 30 days supply | Qty: 60 | Fill #0 | Status: AC

## 2020-12-01 MED FILL — Atorvastatin Calcium Tab 80 MG (Base Equivalent): ORAL | 30 days supply | Qty: 30 | Fill #0 | Status: AC

## 2020-12-01 MED FILL — Metoprolol Tartrate Tab 25 MG: ORAL | 30 days supply | Qty: 30 | Fill #0 | Status: AC

## 2020-12-04 ENCOUNTER — Encounter: Payer: Self-pay | Admitting: Nurse Practitioner

## 2020-12-21 ENCOUNTER — Ambulatory Visit: Payer: Self-pay | Admitting: Nurse Practitioner

## 2020-12-28 ENCOUNTER — Other Ambulatory Visit: Payer: Self-pay

## 2020-12-28 MED FILL — Losartan Potassium Tab 25 MG: ORAL | 30 days supply | Qty: 15 | Fill #1 | Status: AC

## 2020-12-28 MED FILL — Atorvastatin Calcium Tab 80 MG (Base Equivalent): ORAL | 30 days supply | Qty: 30 | Fill #1 | Status: AC

## 2020-12-28 MED FILL — Metoprolol Tartrate Tab 25 MG: ORAL | 30 days supply | Qty: 30 | Fill #1 | Status: AC

## 2020-12-28 MED FILL — Ticagrelor Tab 90 MG: ORAL | 30 days supply | Qty: 60 | Fill #1 | Status: AC

## 2020-12-29 ENCOUNTER — Other Ambulatory Visit: Payer: Self-pay

## 2021-01-09 ENCOUNTER — Ambulatory Visit: Payer: Self-pay | Admitting: Nurse Practitioner

## 2021-01-31 ENCOUNTER — Other Ambulatory Visit: Payer: Self-pay

## 2021-01-31 MED FILL — Atorvastatin Calcium Tab 80 MG (Base Equivalent): ORAL | 30 days supply | Qty: 30 | Fill #2 | Status: AC

## 2021-01-31 MED FILL — Ticagrelor Tab 90 MG: ORAL | 30 days supply | Qty: 60 | Fill #2 | Status: AC

## 2021-01-31 MED FILL — Metoprolol Tartrate Tab 25 MG: ORAL | 30 days supply | Qty: 30 | Fill #2 | Status: AC

## 2021-01-31 MED FILL — Losartan Potassium Tab 25 MG: ORAL | 30 days supply | Qty: 15 | Fill #2 | Status: AC

## 2021-02-06 ENCOUNTER — Ambulatory Visit (INDEPENDENT_AMBULATORY_CARE_PROVIDER_SITE_OTHER): Payer: Self-pay | Admitting: Physician Assistant

## 2021-02-06 ENCOUNTER — Encounter: Payer: Self-pay | Admitting: Physician Assistant

## 2021-02-06 ENCOUNTER — Other Ambulatory Visit (INDEPENDENT_AMBULATORY_CARE_PROVIDER_SITE_OTHER): Payer: Self-pay

## 2021-02-06 VITALS — BP 100/50 | HR 94 | Ht 71.0 in | Wt 147.0 lb

## 2021-02-06 DIAGNOSIS — Z1212 Encounter for screening for malignant neoplasm of rectum: Secondary | ICD-10-CM

## 2021-02-06 DIAGNOSIS — R7989 Other specified abnormal findings of blood chemistry: Secondary | ICD-10-CM

## 2021-02-06 DIAGNOSIS — Z1211 Encounter for screening for malignant neoplasm of colon: Secondary | ICD-10-CM

## 2021-02-06 LAB — CBC WITH DIFFERENTIAL/PLATELET
Basophils Absolute: 0 10*3/uL (ref 0.0–0.1)
Basophils Relative: 0.6 % (ref 0.0–3.0)
Eosinophils Absolute: 0.3 10*3/uL (ref 0.0–0.7)
Eosinophils Relative: 4 % (ref 0.0–5.0)
HCT: 39.3 % (ref 39.0–52.0)
Hemoglobin: 13.3 g/dL (ref 13.0–17.0)
Lymphocytes Relative: 25 % (ref 12.0–46.0)
Lymphs Abs: 1.6 10*3/uL (ref 0.7–4.0)
MCHC: 33.8 g/dL (ref 30.0–36.0)
MCV: 90.5 fl (ref 78.0–100.0)
Monocytes Absolute: 0.5 10*3/uL (ref 0.1–1.0)
Monocytes Relative: 8.5 % (ref 3.0–12.0)
Neutro Abs: 4 10*3/uL (ref 1.4–7.7)
Neutrophils Relative %: 61.9 % (ref 43.0–77.0)
Platelets: 257 10*3/uL (ref 150.0–400.0)
RBC: 4.34 Mil/uL (ref 4.22–5.81)
RDW: 15.6 % — ABNORMAL HIGH (ref 11.5–15.5)
WBC: 6.4 10*3/uL (ref 4.0–10.5)

## 2021-02-06 LAB — COMPREHENSIVE METABOLIC PANEL
ALT: 34 U/L (ref 0–53)
AST: 25 U/L (ref 0–37)
Albumin: 4.2 g/dL (ref 3.5–5.2)
Alkaline Phosphatase: 119 U/L — ABNORMAL HIGH (ref 39–117)
BUN: 18 mg/dL (ref 6–23)
CO2: 26 mEq/L (ref 19–32)
Calcium: 9.5 mg/dL (ref 8.4–10.5)
Chloride: 106 mEq/L (ref 96–112)
Creatinine, Ser: 0.96 mg/dL (ref 0.40–1.50)
GFR: 83.92 mL/min (ref >60.00)
Glucose, Bld: 91 mg/dL (ref 70–99)
Potassium: 4.4 mEq/L (ref 3.5–5.1)
Sodium: 139 mEq/L (ref 135–145)
Total Bilirubin: 0.4 mg/dL (ref 0.2–1.2)
Total Protein: 7.3 g/dL (ref 6.0–8.3)

## 2021-02-06 LAB — IBC PANEL
Iron: 85 ug/dL (ref 42–165)
Saturation Ratios: 20.9 % (ref 20.0–50.0)
Transferrin: 290 mg/dL (ref 212.0–360.0)

## 2021-02-06 LAB — PROTIME-INR
INR: 1 ratio (ref 0.8–1.0)
Prothrombin Time: 11.4 s (ref 9.6–13.1)

## 2021-02-06 LAB — FERRITIN: Ferritin: 15.1 ng/mL — ABNORMAL LOW (ref 22.0–322.0)

## 2021-02-06 NOTE — Patient Instructions (Signed)
If you are age 64 or older, your body mass index should be between 23-30. Your Body mass index is 20.5 kg/m. If this is out of the aforementioned range listed, please consider follow up with your Primary Care Provider.  If you are age 38 or younger, your body mass index should be between 19-25. Your Body mass index is 20.5 kg/m. If this is out of the aformentioned range listed, please consider follow up with your Primary Care Provider.    You have been scheduled for an abdominal ultrasound at [facility] (1st floor of hospital) on 02/16/21 at 10am. Please arrive 15 minutes prior to your appointment for registration. Make certain not to have anything to eat or drink 6 hours prior to your appointment. Should you need to reschedule your appointment, please contact radiology at [phone]. This test typically takes about 30 minutes to perform.  Your provider has requested that you go to the basement level for lab work before leaving today. Press "B" on the elevator. The lab is located at the first door on the left as you exit the elevator.   Your provider has ordered Cologuard testing as an option for colon cancer screening. This is performed by [employer] and may be out of network with your insurance. PRIOR to completing the test, it is YOUR responsibility to contact your insurance about covered benefits for this test. Your out of pocket expense could be anywhere from $0.00 to $649.00.   When you call to check coverage with your insurer, please provide the following information:   -The ONLY provider of Cologuard is Opt[facility], dispensing  - CPT code for Cologuard is [phone].  [employer] NPI # [id number]  -[employer] Tax ID # [id number]   We have already sent your demographic and insurance information to [employer] (phone number [phone]) and they should contact you within the next week regarding your test. If you have not heard from them  within the next week, please call our office at [phone].    The [place] [facility] providers would like to encourage you to use [facility] to communicate with providers for non-urgent requests or questions.  Due to long hold times on the telephone, sending your provider a message by [facility] may be a faster and more efficient way to get a response.  Please allow 48 business hours for a response.  Please remember that this is for non-urgent requests.   It was a pleasure to see you today!  Thank you for trusting me with your gastrointestinal care!    Hyacinth Meeker , PA-C

## 2021-02-06 NOTE — Progress Notes (Signed)
Chief Complaint: Elevated LFTs  HPI:    Eddie Joseph is a 65 year old male with a past medical history as listed below including coronary stent placement in November 2021 on Brilinta, who presented to clinic today with a complaint of elevated LFTs.    02/11/2014 CT of the abdomen pelvis without contrast showed normal liver.    7 months ago liver enzymes normal.  4 months ago alk phos 149, 2 months ago alk phos 159.    Today, the patient tells me he has no problems at all but was told that his liver enzymes are increasing that he needed to follow with [place].  Does tell me he drinks 3 beers nightly and has done this for years, but "I never touched the hard liquor".  Denies family history of liver disease, personal IV drug use or tattoos.    Denies previous colon cancer screening.    Denies fever, chills, weight loss, change in bowel habits, abdominal pain, heartburn, reflux, nausea or vomiting.  Past Medical History:  Diagnosis Date   Coronary artery disease    Hyperlipidemia    Myocardial infarction [facility])     Past Surgical History:  Procedure Laterality Date   CORONARY STENT INTERVENTION N/A 07/02/2020   Procedure: CORONARY STENT INTERVENTION;  Surgeon: Sherren Mocha, MD;  Location: [place] [facility];  Service: Cardiovascular;  Laterality: N/A;   CORONARY/GRAFT ACUTE MI REVASCULARIZATION N/A 07/02/2020   Procedure: Coronary/Graft Acute MI Revascularization;  Surgeon: Sherren Mocha, MD;  Location: [place] [facility];  Service: Cardiovascular;  Laterality: N/A;   LEFT HEART CATH AND CORONARY ANGIOGRAPHY N/A 07/02/2020   Procedure: LEFT HEART CATH AND CORONARY ANGIOGRAPHY;  Surgeon: Sherren Mocha, MD;  Location: [place] [facility];  Service: Cardiovascular;  Laterality: N/A;    Current Outpatient Medications  Medication Sig Dispense Refill   aspirin 81 MG EC tablet TAKE 1 TABLET (81 MG TOTAL) BY MOUTH DAILY. SWALLOW WHOLE. 30 tablet 0   atorvastatin (LIPITOR) 80 MG tablet Take 1 tablet  (80 mg total) by mouth daily. 90 tablet 3   losartan (COZAAR) 25 MG tablet Take 0.5 tablets (12.5 mg total) by mouth daily. 45 tablet 3   metoprolol tartrate (LOPRESSOR) 25 MG tablet TAKE 1/2 TABLET BY MOUTH 2 TIMES DAILY. 90 tablet 3   nitroGLYCERIN (NITROSTAT) 0.4 MG SL tablet PLACE 1 TABLET (0.4 MG TOTAL) UNDER THE TONGUE EVERY FIVE MINUTES X 3 DOSES AS NEEDED FOR CHEST PAIN. 25 tablet 0   ticagrelor (BRILINTA) 90 MG TABS tablet TAKE 1 TABLET (90 MG TOTAL) BY MOUTH 2 (TWO) TIMES DAILY. 180 tablet 3   No current facility-administered medications for this visit.    Allergies as of 02/06/2021   (No Known Allergies)    Family History  Problem Relation Age of Onset   Cancer Mother        breast ca   Heart disease Mother        MI at 81, deceased   Heart disease Father 46       complication of PNS- deceased age 101    Social History   Socioeconomic History   Marital status: Single    Spouse name: Not on file   Number of children: Not on file   Years of education: Not on file   Highest education level: Not on file  Occupational History   Not on file  Tobacco Use   Smoking status: Every Day    Packs/day: 1.00    Pack years: 0.00  Types: Cigarettes   Smokeless tobacco: Never  Vaping Use   Vaping Use: Never used  Substance and Sexual Activity   Alcohol use: Yes    Alcohol/week: 10.0 standard drinks    Types: 10 Cans of beer per week   Drug use: No   Sexual activity: Not on file  Other Topics Concern   Not on file  Social History Narrative   Not on file   Social Determinants of Health   Financial Resource Strain: Not on file  Food Insecurity: Not on file  Transportation Needs: Not on file  Physical Activity: Not on file  Stress: Not on file  Social Connections: Not on file  Intimate Partner Violence: Not on file    Review of Systems:    Constitutional: No weight loss, fever or chills Skin: No rash  Cardiovascular: No chest pain Respiratory: No SOB   Gastrointestinal: See HPI and otherwise negative Genitourinary: No dysuria  Neurological: No headache, dizziness or syncope Musculoskeletal: No new muscle or joint pain Hematologic: No bleeding  Psychiatric: No history of depression or anxiety   Physical Exam:  Vital signs: BP (!) 100/50   Pulse 94   Ht 5' 11"  (1.803 m)   Wt 147 lb (66.7 kg)   BMI 20.50 kg/m   Constitutional:   Pleasant Caucasian male appears to be in NAD, Well developed, Well nourished, alert and cooperative Head:  Normocephalic and atraumatic. Eyes:   PEERL, EOMI. No icterus. Conjunctiva pink. Ears:  Normal auditory acuity. Neck:  Supple Throat: Oral cavity and pharynx without inflammation, swelling or lesion.  Respiratory: Respirations even and unlabored. Lungs clear to auscultation bilaterally.   No wheezes, crackles, or rhonchi.  Cardiovascular: Normal S1, S2. No MRG. Regular rate and rhythm. No peripheral edema, cyanosis or pallor.  Gastrointestinal:  Soft, nondistended, nontender. No rebound or guarding. Normal bowel sounds. No appreciable masses or hepatomegaly. Rectal:  Not performed.  Msk:  Symmetrical without gross deformities. Without edema, no deformity or joint abnormality.  Neurologic:  Alert and  oriented x4;  grossly normal neurologically.  Skin:   Dry and intact without significant lesions or rashes. Psychiatric:  Demonstrates good judgement and reason without abnormal affect or behaviors.  RELEVANT LABS AND IMAGING: CBC    Component Value Date/Time   WBC 7.6 07/25/2020 1137   WBC 9.7 07/03/2020 0430   RBC 3.82 (L) 07/25/2020 1137   RBC 4.05 (L) 07/03/2020 0430   HGB 12.6 (L) 07/25/2020 1137   HCT 36.3 (L) 07/25/2020 1137   PLT 244 07/25/2020 1137   MCV 95 07/25/2020 1137   MCH 33.0 07/25/2020 1137   MCH 33.3 07/03/2020 0430   MCHC 34.7 07/25/2020 1137   MCHC 33.8 07/03/2020 0430   RDW 11.9 07/25/2020 1137   LYMPHSABS 3.0 07/02/2020 0652   MONOABS 0.9 07/02/2020 0652   EOSABS 0.5  07/02/2020 0652   BASOSABS 0.1 07/02/2020 0652    CMP     Component Value Date/Time   NA 143 09/25/2020 0946   K 4.6 09/25/2020 0946   CL 106 09/25/2020 0946   CO2 22 09/25/2020 0946   GLUCOSE 93 09/25/2020 0946   GLUCOSE 109 (H) 07/03/2020 0430   BUN 7 (L) 09/25/2020 0946   CREATININE 1.17 09/25/2020 0946   CALCIUM 9.3 09/25/2020 0946   PROT 6.7 11/24/2020 0732   ALBUMIN 4.0 11/24/2020 0732   AST 21 11/24/2020 0732   ALT 21 11/24/2020 0732   ALKPHOS 159 (H) 11/24/2020 0732   BILITOT <0.2 11/24/2020  Circle 09/25/2020 0946   GFRNONAA >60 07/03/2020 0430   GFRAA 76 09/25/2020 0946    Assessment: 1.  Elevated alkaline phosphatase: See HPI for details, elevated over the past 6 months 2.  Screening for colorectal cancer: Patient is 44 and never had screening of any form  Plan: 1.  Patient would like to have Cologuard for colon cancer screening.  Ordered this today. 2.  Ordered a right upper quadrant ultrasound for further evaluation of elevated alk phos. 3.  Ordered further liver labs including CBC, CMP, PT/INR, TTG, IgA, iron studies, ceruloplasmin, ANA, ASMA, AMA, alpha-1 antitrypsin and hepatitis panel 4.  Patient assigned to Dr. Havery Moros this afternoon.  He will follow in clinic per recommendations after labs above.  Ellouise Newer, PA-C [facility] 02/06/2021, 2:18 PM  Cc: No ref. provider found

## 2021-02-07 ENCOUNTER — Other Ambulatory Visit (INDEPENDENT_AMBULATORY_CARE_PROVIDER_SITE_OTHER): Payer: Self-pay

## 2021-02-07 DIAGNOSIS — R7989 Other specified abnormal findings of blood chemistry: Secondary | ICD-10-CM

## 2021-02-07 LAB — GAMMA GT: GGT: 71 U/L — ABNORMAL HIGH (ref 7–51)

## 2021-02-07 NOTE — Progress Notes (Signed)
Agree with assessment and plan with the following thoughts: - AP elevation mild on repeat labs with normal AST / ALT. Victorino Dike can we add a GGT on to the labs that were drawn to clarify where this is coming from, confirm hepatic in etiology? Thanks

## 2021-02-09 LAB — HEPATITIS PANEL, ACUTE
Hep A IgM: NONREACTIVE
Hep B C IgM: NONREACTIVE
Hepatitis B Surface Ag: NONREACTIVE
Hepatitis C Ab: NONREACTIVE
SIGNAL TO CUT-OFF: 0.03 (ref <1.00)

## 2021-02-09 LAB — TISSUE TRANSGLUTAMINASE, IGA: (tTG) Ab, IgA: <1 U/mL

## 2021-02-09 LAB — ANTI-SMOOTH MUSCLE ANTIBODY, IGG: Actin (Smooth Muscle) Antibody (IGG): 23 U — ABNORMAL HIGH (ref <20)

## 2021-02-09 LAB — MITOCHONDRIAL ANTIBODIES: Mitochondrial M2 Ab, IgG: <=20 U

## 2021-02-09 LAB — IGA: Immunoglobulin A: 210 mg/dL (ref 70–320)

## 2021-02-09 LAB — CERULOPLASMIN: Ceruloplasmin: 29 mg/dL (ref 18–36)

## 2021-02-09 LAB — ALPHA-1-ANTITRYPSIN: A-1 Antitrypsin, Ser: 201 mg/dL — ABNORMAL HIGH (ref 83–199)

## 2021-02-09 LAB — ANA: Anti Nuclear Antibody (ANA): NEGATIVE

## 2021-02-13 ENCOUNTER — Other Ambulatory Visit: Payer: Self-pay

## 2021-02-13 DIAGNOSIS — D509 Iron deficiency anemia, unspecified: Secondary | ICD-10-CM

## 2021-02-13 DIAGNOSIS — R7989 Other specified abnormal findings of blood chemistry: Secondary | ICD-10-CM

## 2021-02-13 DIAGNOSIS — Z1211 Encounter for screening for malignant neoplasm of colon: Secondary | ICD-10-CM

## 2021-02-14 ENCOUNTER — Telehealth: Payer: Self-pay

## 2021-02-14 NOTE — Telephone Encounter (Signed)
Will send message to surgeon's office to clarify the urgency of procedure, see notes from pre op provider.

## 2021-02-14 NOTE — Telephone Encounter (Signed)
He has an iron deficiency and has never had a prior colonoscopy, so we need to rule out a bowel malignancy. I don't think it would be wise to wait until November in case he has something hiding in his colon or bowel. Do you think it possible we can hold Brilinta for this? If not we can do a diagnostic exam on Brilinta but we can't remove any polyps and would likely have to bring him back for multiple procedures if he can't hold it. Thanks

## 2021-02-14 NOTE — Telephone Encounter (Signed)
[facility] Pre-operative Risk Assessment     Request for surgical clearance:     Endoscopy Procedure  What type of surgery is being performed?     Upper endoscopy and Colonoscopy  When is this surgery scheduled?     04/11/21  What type of clearance is required ?   Pharmacy  Are there any medications that need to be held prior to surgery and how long? Brilinta - 5 days  Practice name and name of physician performing surgery?      [facility]  What is your office phone and fax number?      Phone- [phone]  [phone]  Anesthesia type (None, local, MAC, general) ?       MAC

## 2021-02-14 NOTE — Telephone Encounter (Signed)
Pt had PCI 06/2020. Please ask the urgency of this procedure. I was unable to get through when I called. Can it postpone until 06/2021? If not, I'll reach out to primary cardiologist to determine earliest date that brilinta can be held.

## 2021-02-15 NOTE — Telephone Encounter (Signed)
Patient was calling back with more question

## 2021-02-15 NOTE — Telephone Encounter (Signed)
Dr. 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 He has residual disease of 50% followed by 70% in the proximal to mid LAD. He remains on ASA and brilinta. He now needs a colonoscopy for suspected bowel malignancy, per GI there is some urgency to the procedure and they prefer to proceed ASAP and not wait until Nov when he is 12 months from PCI. GI is requesting to hold brilinta for 5 days for procedure on 04/11/21. At that point he will be 9 months from PCI. 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  OK to hold brilinta for 5 days?

## 2021-02-16 ENCOUNTER — Other Ambulatory Visit: Payer: Self-pay

## 2021-02-16 ENCOUNTER — Ambulatory Visit (HOSPITAL_COMMUNITY)
Admission: RE | Admit: 2021-02-16 | Discharge: 2021-02-16 | Disposition: A | Discharge status: Home / Self Care | Payer: Self-pay | Source: Ambulatory Visit | Attending: Physician Assistant | Admitting: Physician Assistant

## 2021-02-16 ENCOUNTER — Telehealth: Payer: Self-pay | Admitting: Cardiovascular Disease

## 2021-02-16 DIAGNOSIS — R7989 Other specified abnormal findings of blood chemistry: Secondary | ICD-10-CM | POA: Insufficient documentation

## 2021-02-16 DIAGNOSIS — Z1212 Encounter for screening for malignant neoplasm of rectum: Secondary | ICD-10-CM | POA: Insufficient documentation

## 2021-02-16 DIAGNOSIS — Z1211 Encounter for screening for malignant neoplasm of colon: Secondary | ICD-10-CM | POA: Insufficient documentation

## 2021-02-16 NOTE — Telephone Encounter (Signed)
See phone note from 6/22

## 2021-02-16 NOTE — Telephone Encounter (Signed)
Pt called in today with more questions for pre op provider; see phone note from 02/14/21. I will send notes back to the pre op pool to reach out to the pt.

## 2021-02-19 ENCOUNTER — Telehealth: Payer: Self-pay | Admitting: Physician Assistant

## 2021-02-19 NOTE — Telephone Encounter (Signed)
Telephone call  02/20/2019 to 12:20 PM  Received message from Marcelino Duster, [place] with cardiology explaining that patient was worried about upcoming procedures and asking for [place] to call him.  Reached out to the patient today.  He had heard that there was a possibility of cancer which was why we wanted to do the procedures which had worried him.  He tells me he had "started to live like I was a dying man".  Explained that the reason we are wanting to do an EGD and colonoscopy is for a low ferritin (part of his iron studies which can indicate anemia).  Discussed that there are many reasons that he could be anemic and usually the first step in work-up is an endoscopy and colonoscopy to identify if there is anything in his GI tract that could be causing this.  On the list of possibilities in our differential diagnosis is a malignancy of some sort.  We do not suspect this in him any more than we do in anyone else going through this work-up at this point, but due to his low ferritin levels Dr. Adela Lank wanted to go ahead and see if we could stop his Brilinta earlier to get these procedures done sooner for him.  Spent time answering patient's other questions.  He was reassured after the phone call and is aware that we are just waiting to hear back from cardiology at this point and then we can schedule him from procedures pending what they think.  Hyacinth Meeker, PA-C

## 2021-02-19 NOTE — Telephone Encounter (Signed)
Victorino Dike thanks very much for the follow up and taking the time to review everything with him. He has a mild IDA, Hgb relatively normal, so hopefully nothing concerning but it does warrant evaluation. Thanks

## 2021-02-19 NOTE — Telephone Encounter (Signed)
Thanks Angie. I think he would be at acceptably low risk of holding brilinta for 5 days prior to colonoscopy in August as planned. Thanks - Kathlene November

## 2021-02-19 NOTE — Telephone Encounter (Signed)
Called patient. Answered as many questions as possible. He will need to call GI for the remaining questions regarding need for colonoscopy. He states cologuard was $600 and insurance wouldn't pay. He states he was not aware of needing a colonoscopy or the urgency of the procedure. I have asked him to reach out to [facility] for clarification.

## 2021-02-20 ENCOUNTER — Telehealth: Payer: Self-pay | Admitting: Cardiovascular Disease

## 2021-02-20 NOTE — Telephone Encounter (Signed)
Thank you very much I appreciate the follow up.  [name] would you mind calling this patient to remind him when the time comes due for him to hold his Eddie Joseph so he remembers? Thanks

## 2021-02-20 NOTE — Telephone Encounter (Signed)
Patient was returning call 

## 2021-02-20 NOTE — Telephone Encounter (Signed)
See clearance notes  

## 2021-02-20 NOTE — Telephone Encounter (Signed)
I just s/w the pt and he has been advised he has been cleared for his procedure in August with Dr. Adela Lank. Pt is aware he will hold his Brilinta x 5 days prior to procedure with GI. Pt verbalized understanding to plan of care. I assured the pt that I will send over notes to Dr. Adela Lank. Pt asked if someone could remind him when he should stop the Brilinta. I advised the pt that he may want to reach out to GI office for reminder. Though, I did tell the pt x 2 he will hold the Brilinta x 5 days prior to the procedure. Pt thanked me for the call.

## 2021-02-20 NOTE — Telephone Encounter (Signed)
See clearance in epic for notes about Brilinta

## 2021-02-20 NOTE — Telephone Encounter (Signed)
I tried to reach the pt to inform him that he has been cleared for his upcoming procedure. Called to let pt know it is ok per Dr. Excell Seltzer to hold Brilinta x 5 days for upcoming procedure. There was no answer or vm. Could not leave a message. I will fax clearance notes to surgeon's office as I am sure they will go through instructions as well with the pt.

## 2021-02-20 NOTE — Telephone Encounter (Signed)
   Name: Eddie Joseph  DOB: [birth year]  MRN: [id number]   Primary Cardiologist: Tonny Bollman, MD  Chart reviewed as part of pre-operative protocol coverage. Patient was contacted 02/20/2021 in reference to pre-operative risk assessment for pending surgery as outlined below.  Eddie Joseph was last seen on 08/29/20 by Tereso Newcomer.  Since that day, Eddie Joseph has done well. He suffered an inferior STEMI treated with DES to RCA 07/02/20. We are asked to hold brilinta for urgent colonoscopy in August. I reached out to Dr. Excell Seltzer who agreed that brilinta could be held for 5 days in August.   Therefore, based on ACC/AHA guidelines, the patient would be at acceptable risk for the planned procedure without further cardiovascular testing.   The patient was advised that if he develops new symptoms prior to surgery to contact our office to arrange for a follow-up visit, and he verbalized understanding.  I will route this recommendation to the requesting party via Epic fax function and remove from pre-op pool. Please call with questions.  Marcelino Duster, PA 02/20/2021, 8:47 AM

## 2021-03-01 ENCOUNTER — Other Ambulatory Visit: Payer: Self-pay

## 2021-03-01 ENCOUNTER — Other Ambulatory Visit: Payer: Self-pay | Admitting: Cardiology

## 2021-03-01 MED ORDER — ATORVASTATIN CALCIUM 80 MG PO TABS
ORAL_TABLET | Freq: Every day | ORAL | 1 refills | Status: DC
  Filled 2021-03-01: qty 30, 30d supply, fill #0
  Filled 2021-04-02: qty 30, 30d supply, fill #1
  Filled 2021-05-01: qty 30, 30d supply, fill #2
  Filled 2021-05-31: qty 30, 30d supply, fill #3
  Filled 2021-07-02: qty 30, 30d supply, fill #4
  Filled 2021-07-31: qty 30, 30d supply, fill #5

## 2021-03-01 MED ORDER — LOSARTAN POTASSIUM 25 MG PO TABS
ORAL_TABLET | Freq: Every day | ORAL | 1 refills | Status: DC
  Filled 2021-03-01: qty 15, 30d supply, fill #0
  Filled 2021-04-02: qty 15, 30d supply, fill #1
  Filled 2021-05-01: qty 15, 30d supply, fill #2
  Filled 2021-05-31: qty 15, 30d supply, fill #3
  Filled 2021-07-02: qty 15, 30d supply, fill #4
  Filled 2021-07-31: qty 15, 30d supply, fill #5

## 2021-03-01 MED ORDER — TICAGRELOR 90 MG PO TABS
ORAL_TABLET | Freq: Two times a day (BID) | ORAL | 1 refills | Status: DC
  Filled 2021-04-02: qty 60, 30d supply, fill #0
  Filled 2021-05-01: qty 60, 30d supply, fill #1
  Filled 2021-05-31: qty 60, 30d supply, fill #2
  Filled 2021-07-02: qty 60, 30d supply, fill #3

## 2021-03-01 MED ORDER — METOPROLOL TARTRATE 25 MG PO TABS
ORAL_TABLET | ORAL | 1 refills | Status: DC
  Filled 2021-04-02: qty 30, 30d supply, fill #0
  Filled 2021-05-01: qty 30, 30d supply, fill #1
  Filled 2021-05-31: qty 30, 30d supply, fill #2
  Filled 2021-07-02: qty 30, 30d supply, fill #3
  Filled 2021-07-31: qty 30, 30d supply, fill #4
  Filled 2021-09-03: qty 30, 30d supply, fill #0

## 2021-03-01 MED FILL — Metoprolol Tartrate Tab 25 MG: ORAL | 30 days supply | Qty: 30 | Fill #3 | Status: AC

## 2021-03-01 MED FILL — Ticagrelor Tab 90 MG: ORAL | 30 days supply | Qty: 60 | Fill #3 | Status: AC

## 2021-03-16 ENCOUNTER — Telehealth: Payer: Self-pay

## 2021-03-16 NOTE — Telephone Encounter (Signed)
-----   Message from Missy Sabins, RN sent at 02/13/2021 10:51 AM EDT ----- Regarding: Labs Repeat hepatic function panel, total IgG. Orders in epic.

## 2021-03-16 NOTE — Telephone Encounter (Signed)
Pt states he will try to come Monday for labs.

## 2021-03-18 NOTE — Progress Notes (Signed)
Cardiology Office Note:    Date:  03/19/2021   ID:  Eddie Joseph, DOB [id number], MRN [id number]  PCP:  Patient, No Pcp Per (Inactive)   [place] Prov[facility] Cardiologist:  Sherren Mocha, MD     Referring MD: No ref. provider found   Chief Complaint  Patient presents with   Coronary Artery Disease     History of Present Illness:    Eddie Joseph is a 66 y.o. 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  He was last seen by [name] in January 2022, at which time he was doing well with no cardiovascular symptoms.  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  He denies any exertional symptoms.  He specifically denies chest pain, chest pressure, or shortness of breath.  He has occasional dizziness with positional change but seems to be tolerating his medicines well.  He is compliant with his medications.  He continues to smoke cigarettes but less than previous.  He continues to drink beer but has also cut back.  Past Medical History:  Diagnosis Date   Coronary artery disease    Hyperlipidemia    Myocardial infarction [facility])     Past Surgical History:  Procedure Laterality Date   CORONARY STENT INTERVENTION N/A 07/02/2020   Procedure: CORONARY STENT INTERVENTION;  Surgeon: Sherren Mocha, MD;  Location: [place] [facility];  Service: Cardiovascular;  Laterality: N/A;   CORONARY/GRAFT ACUTE MI REVASCULARIZATION N/A 07/02/2020   Procedure: Coronary/Graft Acute MI Revascularization;  Surgeon: Sherren Mocha, MD;  Location: [place] [facility];  Service: Cardiovascular;  Laterality: N/A;   LEFT HEART CATH AND CORONARY ANGIOGRAPHY N/A 07/02/2020   Procedure: LEFT HEART  CATH AND CORONARY ANGIOGRAPHY;  Surgeon: Sherren Mocha, MD;  Location: [place] [facility];  Service: Cardiovascular;  Laterality: N/A;    Current Medications: Current Meds  Medication Sig   aspirin 81 MG EC tablet TAKE 1 TABLET (81 MG TOTAL) BY MOUTH DAILY. SWALLOW WHOLE.   atorvastatin (LIPITOR) 80 MG tablet TAKE 1 TABLET (80 MG TOTAL) BY MOUTH DAILY.   losartan (COZAAR) 25 MG tablet TAKE 1/2 TABLET BY MOUTH DAILY.   metoprolol tartrate (LOPRESSOR) 25 MG tablet TAKE 1/2 TABLET BY MOUTH 2 TIMES DAILY.   nitroGLYCERIN (NITROSTAT) 0.4 MG SL tablet PLACE 1 TABLET (0.4 MG TOTAL) UNDER THE TONGUE EVERY FIVE MINUTES X 3 DOSES AS NEEDED FOR CHEST PAIN.   ticagrelor (BRILINTA) 90 MG TABS tablet TAKE 1 TABLET (90 MG TOTAL) BY MOUTH 2 (TWO) TIMES DAILY.     Allergies:   Patient has no known allergies.   Social History   Socioeconomic History   Marital status: Single    Spouse name: Not on file   Number of children: Not on file   Years of education: Not on file   Highest education level: Not on file  Occupational History   Not on file  Tobacco Use   Smoking status: Every Day    Packs/day: 1.00    Types: Cigarettes   Smokeless tobacco: Never  Vaping Use   Vaping Use: Never used  Substance and Sexual Activity   Alcohol use: Yes    Alcohol/week: 10.0 standard drinks    Types:  10 Cans of beer per week   Drug use: No   Sexual activity: Not on file  Other Topics Concern   Not on file  Social History Narrative   Not on file   Social Determinants of Health   Financial Resource Strain: Not on file  Food Insecurity: Not on file  Transportation Needs: Not on file  Physical Activity: Not on file  Stress: Not on file  Social Connections: Not on file     Family History: The patient's family history includes Cancer in his mother; Heart disease in his mother; Heart disease (age of onset: 31) in his father.  ROS:   Please see the history of present illness.    All other systems  reviewed and are negative.  EKGs/Labs/Other Studies Reviewed:    The following studies were reviewed today: Cardiac Cath 07/02/2020: Conclusion  1.  Total occlusion of the proximal RCA, treated successfully with primary PCI using a 3.5 x 38 mm resolute Onyx DES 2.  Moderate ostial and proximal LAD stenoses, 50 to 70% range luminal narrowing 3.  Patent left main and left circumflex with minimal luminal irregularities 4.  Normal LV systolic function with normal LVEDP 5.  Cardiogenic shock treated with vasopressor therapy and reperfusion therapy   Recommendations: ICU care, dual antiplatelet therapy with aspirin and ticagrelor at least 12 months (ticagrelor 180 mg administered orally at the end of the procedure), tirofiban x6 hours, low-dose norepinephrine infusion at completion of procedure titrate to MAP greater than 65 or SBP greater than 90, IV amiodarone for treatment of atrial fibrillation  Diagnostic Dominance: Right  Left Anterior Descending  Ost LAD to Prox LAD lesion is 50% stenosed. Ostial lesion, likely a component of catheter induced vasospasm  Mid LAD lesion is 70% stenosed. Focal lesion  Left Circumflex  The vessel exhibits minimal luminal irregularities. Patent vessel with minimal nonobstructive disease  Right Coronary Artery  Prox RCA lesion is 100% stenosed. The lesion is heavily thrombotic.   Intervention   Prox RCA lesion  Stent  CATH LAUNCHER 6FR JR4 guide catheter was inserted. Lesion crossed with guidewire using a WIRE COUGAR XT STRL 190CM. Pre-stent angioplasty was performed using a BALLOON SAPPHIRE 2.5X12. Maximum pressure: 6 atm. A drug-eluting stent was successfully placed using a STENT RESOLUTE ONYX 3.5X38. Maximum pressure: 12 atm. Post-stent angioplasty was performed using a BALLOON SAPPHIRE [place] [id number]. Maximum pressure: 14 atm.  Post-Intervention Lesion Assessment  The intervention was successful. Pre-interventional TIMI flow is 0. Post-intervention TIMI  flow is 3. No complications occurred at this lesion.  There is a 0% residual stenosis post intervention.    Wall Motion  Resting                Left Heart  Left Ventricle The left ventricular size is normal. The left ventricular systolic function is normal. LV end diastolic pressure is normal. The left ventricular ejection fraction is greater than 65% by visual estimate. No regional wall motion abnormalities.   Coronary Diagrams   Diagnostic Dominance: Right    Intervention     Echo 07/02/2020: 1. Left ventricular ejection fraction, by estimation, is 50 to 55%. The  left ventricle has normal function. The left ventricle has no regional  wall motion abnormalities. Left ventricular diastolic parameters are  indeterminate.   2. Right ventricular systolic function is normal. The right ventricular  size is normal.   3. The mitral valve is normal in structure. No evidence of mitral valve  regurgitation. No evidence of  mitral stenosis.   4. The aortic valve was not well visualized. Aortic valve regurgitation  is not visualized. No aortic stenosis is present.   5. The inferior vena cava is normal in size with greater than 50%  respiratory variability, suggesting right atrial pressure of 3 mmHg.   EKG:  EKG is not ordered today.    Recent Labs: 02/06/2021: ALT 34; BUN 18; Creatinine, Ser 0.96; Hemoglobin 13.3; Platelets 257.0; Potassium 4.4; Sodium 139  Recent Lipid Panel    Component Value Date/Time   CHOL 124 09/25/2020 0946   TRIG 102 09/25/2020 0946   HDL 40 09/25/2020 0946   CHOLHDL 3.1 09/25/2020 0946   CHOLHDL 8.0 07/03/2020 0430   VLDL 39 07/03/2020 0430   LDLCALC 65 09/25/2020 0946     Risk Assessment/Calculations:           Physical Exam:    VS:  BP 120/80   Pulse 77   Ht 5' 11"  (1.803 m)   Wt 149 lb 12.8 oz (67.9 kg)   SpO2 97%   BMI 20.89 kg/m     Wt Readings from Last 3 Encounters:  03/19/21 149 lb 12.8 oz (67.9 kg)  02/06/21 147 lb  (66.7 kg)  08/29/20 150 lb (68 kg)     GEN:  Well nourished, well developed in no acute distress HEENT: Normal NECK: No JVD; No carotid bruits LYMPHATICS: No lymphadenopathy CARDIAC: RRR, 2/6 midsystolic murmur at the apex RESPIRATORY:  Clear to auscultation without rales, wheezing or rhonchi  ABDOMEN: Soft, non-tender, non-distended MUSCULOSKELETAL:  No edema; No deformity  SKIN: Warm and dry NEUROLOGIC:  Alert and oriented x 3 PSYCHIATRIC:  Normal affect   ASSESSMENT:    1. Coronary artery disease involving native coronary artery of native heart without angina pectoris   2. Mixed hyperlipidemia   3. Tobacco abuse   4. Murmur, cardiac    PLAN:    In order of problems listed above:  The patient appears stable with no symptoms of angina.  He is treated with aspirin, ticagrelor, losartan, metoprolol, and a high intensity statin drug with atorvastatin.  He will continue his current medications through November of this year.  When he returns for follow-up he will be 1 year out from his MI and I think he can discontinue ticagrelor at that point. Lipids are at goal with a cholesterol of 124 and LDL cholesterol of 65 mg/dL.  He will continue atorvastatin 80 mg daily.  He has had an isolated alkaline phosphatase elevation and is under evaluation with GI.  His transaminases are normal. Cessation counseling is done The patient has a heart murmur suggestive of mitral valve prolapse with mitral regurgitation.  An echocardiogram will be ordered.   Medication Adjustments/Labs and Tests Ordered: Current medicines are reviewed at length with the patient today.  Concerns regarding medicines are outlined above.  Orders Placed This Encounter  Procedures   ECHOCARDIOGRAM COMPLETE    No orders of the defined types were placed in this encounter.   Patient Instructions  Medication Instructions:  Your physician recommends that you continue on your current medications as directed. Please refer to  the Current Medication list given to you today.  *If you need a refill on your cardiac medications before your next appointment, please call your pharmacy*   Lab Work: None If you have labs (blood work) drawn today and your tests are completely normal, you will receive your results only by: [place] (if you have MyChart) OR A paper copy  in the mail If you have any lab test that is abnormal or we need to change your treatment, we will call you to review the results.   Testing/Procedures: Your physician has requested that you have an echocardiogram. Echocardiography is a painless test that uses sound waves to create images of your heart. It provides your doctor with information about the size and shape of your heart and how well your heart's chambers and valves are working. This procedure takes approximately one hour. There are no restrictions for this procedure.   Follow-Up: At [facility], you and your health needs are our priority.  As part of our continuing mission to provide you with exceptional heart care, we have created designated Provider Care Teams.  These Care Teams include your primary Cardiologist (physician) and Advanced Practice Providers (APPs -  Physician Assistants and Nurse Practitioners) who all work together to provide you with the care you need, when you need it.  We recommend signing up for the patient portal called "MyChart".  Sign up information is provided on this After Visit Summary.  MyChart is used to connect with patients for Virtual Visits (Telemedicine).  Patients are able to view lab/test results, encounter notes, upcoming appointments, etc.  Non-urgent messages can be sent to your provider as well.   To learn more about what you can do with MyChart, go to [email or web address].    Your next appointment:   4 month(s)  The format for your next appointment:   In Person  Provider:   [name], PA-C   Other Instructions      Signed, Sherren Mocha, MD  03/19/2021 8:23 AM    [place]

## 2021-03-19 ENCOUNTER — Ambulatory Visit (INDEPENDENT_AMBULATORY_CARE_PROVIDER_SITE_OTHER): Payer: Self-pay | Admitting: Cardiovascular Disease

## 2021-03-19 ENCOUNTER — Other Ambulatory Visit: Payer: Self-pay

## 2021-03-19 ENCOUNTER — Encounter (INDEPENDENT_AMBULATORY_CARE_PROVIDER_SITE_OTHER): Payer: Self-pay

## 2021-03-19 ENCOUNTER — Encounter: Payer: Self-pay | Admitting: Cardiovascular Disease

## 2021-03-19 VITALS — BP 120/80 | HR 77 | Ht 71.0 in | Wt 149.8 lb

## 2021-03-19 DIAGNOSIS — E782 Mixed hyperlipidemia: Secondary | ICD-10-CM

## 2021-03-19 DIAGNOSIS — Z720 Tobacco use: Secondary | ICD-10-CM

## 2021-03-19 DIAGNOSIS — R011 Cardiac murmur, unspecified: Secondary | ICD-10-CM

## 2021-03-19 DIAGNOSIS — I2510 Atherosclerotic heart disease of native coronary artery without angina pectoris: Secondary | ICD-10-CM

## 2021-03-19 NOTE — Patient Instructions (Signed)
Medication Instructions:  Your physician recommends that you continue on your current medications as directed. Please refer to the Current Medication list given to you today.  *If you need a refill on your cardiac medications before your next appointment, please call your pharmacy*   Lab Work: None If you have labs (blood work) drawn today and your tests are completely normal, you will receive your results only by: MyChart Message (if you have MyChart) OR A paper copy in the mail If you have any lab test that is abnormal or we need to change your treatment, we will call you to review the results.   Testing/Procedures: Your physician has requested that you have an echocardiogram. Echocardiography is a painless test that uses sound waves to create images of your heart. It provides your doctor with information about the size and shape of your heart and how well your heart's chambers and valves are working. This procedure takes approximately one hour. There are no restrictions for this procedure.   Follow-Up: At [facility], you and your health needs are our priority.  As part of our continuing mission to provide you with exceptional heart care, we have created designated Provider Care Teams.  These Care Teams include your primary Cardiologist (physician) and Advanced Practice Providers (APPs -  Physician Assistants and Nurse Practitioners) who all work together to provide you with the care you need, when you need it.  We recommend signing up for the patient portal called "MyChart".  Sign up information is provided on this After Visit Summary.  MyChart is used to connect with patients for Virtual Visits (Telemedicine).  Patients are able to view lab/test results, encounter notes, upcoming appointments, etc.  Non-urgent messages can be sent to your provider as well.   To learn more about what you can do with MyChart, go to [email or web address].    Your next appointment:   4 month(s)  The  format for your next appointment:   In Person  Provider:   Tereso Newcomer, PA-C   Other Instructions

## 2021-03-22 ENCOUNTER — Other Ambulatory Visit: Payer: Self-pay

## 2021-03-22 DIAGNOSIS — R7989 Other specified abnormal findings of blood chemistry: Secondary | ICD-10-CM

## 2021-03-22 DIAGNOSIS — Z1212 Encounter for screening for malignant neoplasm of rectum: Secondary | ICD-10-CM

## 2021-03-22 DIAGNOSIS — Z1211 Encounter for screening for malignant neoplasm of colon: Secondary | ICD-10-CM

## 2021-03-22 DIAGNOSIS — D509 Iron deficiency anemia, unspecified: Secondary | ICD-10-CM

## 2021-03-22 LAB — HEPATIC FUNCTION PANEL
ALT: 32 U/L (ref 0–53)
AST: 24 U/L (ref 0–37)
Albumin: 4.1 g/dL (ref 3.5–5.2)
Alkaline Phosphatase: 125 U/L — ABNORMAL HIGH (ref 39–117)
Bilirubin, Direct: 0.1 mg/dL (ref 0.0–0.3)
Total Bilirubin: 0.4 mg/dL (ref 0.2–1.2)
Total Protein: 7.2 g/dL (ref 6.0–8.3)

## 2021-03-23 LAB — IGG: IgG (Immunoglobin G), Serum: 1187 mg/dL (ref 600–1540)

## 2021-04-02 ENCOUNTER — Other Ambulatory Visit: Payer: Self-pay

## 2021-04-02 ENCOUNTER — Ambulatory Visit (HOSPITAL_COMMUNITY): Payer: Self-pay | Attending: Cardiology

## 2021-04-02 DIAGNOSIS — R011 Cardiac murmur, unspecified: Secondary | ICD-10-CM

## 2021-04-02 LAB — ECHOCARDIOGRAM COMPLETE
Area-P 1/2: 3.27 cm2
MV M vel: 5.53 m/s
MV Peak grad: 122.3 mmHg
S' Lateral: 2.8 cm

## 2021-04-11 ENCOUNTER — Encounter: Payer: Self-pay | Admitting: Gastroenterology

## 2021-04-17 ENCOUNTER — Encounter: Payer: Self-pay | Admitting: Gastroenterology

## 2021-04-17 ENCOUNTER — Ambulatory Visit (INDEPENDENT_AMBULATORY_CARE_PROVIDER_SITE_OTHER): Payer: Self-pay | Admitting: Gastroenterology

## 2021-04-17 VITALS — BP 128/76 | HR 76 | Ht 71.0 in | Wt 152.0 lb

## 2021-04-17 DIAGNOSIS — D509 Iron deficiency anemia, unspecified: Secondary | ICD-10-CM

## 2021-04-17 DIAGNOSIS — R748 Abnormal levels of other serum enzymes: Secondary | ICD-10-CM

## 2021-04-17 DIAGNOSIS — Z7902 Long term (current) use of antithrombotics/antiplatelets: Secondary | ICD-10-CM

## 2021-04-17 DIAGNOSIS — K219 Gastro-esophageal reflux disease without esophagitis: Secondary | ICD-10-CM

## 2021-04-17 MED ORDER — SUTAB 1479-225-188 MG PO TABS: 1.0000 | ORAL_TABLET | Freq: Once | ORAL | 0 refills | Status: AC

## 2021-04-17 NOTE — Progress Notes (Signed)
HPI :  65 year old male here for a follow-up visit for elevated alk phos and iron deficiency.  He was referred initially to see [place] in June of this year, was seen by Ellouise Newer.  He had a persistent mild elevation in alkaline phosphatase with normal AST and ALT.  He underwent extensive serologic work-up for the elevated alk phos and right upper quadrant ultrasound.  The ultrasound did not show any ductal dilation or concerning findings.  His serologic work-up was unremarkable as to a cause for the elevated alk phos, however he did have a low ferritin consistent with iron deficiency.  He has no anemia and his hemoglobin is normal.  He denies any history of liver disease and denies any family history of liver disease.  He denies any pains in his abdomen.  No history of jaundice.  He does drink alcohol but states usually 1 or 2 drinks per night and then only drinks perhaps a few nights per week, not daily.  He endorsed drinking more routinely when seen by Ellouise Newer in June.  There was no steatosis noted on ultrasound.  He has never had a colonoscopy before.  He has no family history of colon cancer.  He denies any blood in his stools,, which are normal brown color.  Denies any abdominal pains.  No nausea or vomiting.  No dysphagia.  He has had history of reflux in the past and was previously on PPIs but states over the past year this has not really been bothering him and has been taking prep for this at all.  He had a history of MI with stent placement in November 2021 and is on Brilinta.  He had an echocardiogram just a few weeks ago which looked okay as below.  He denies any chest pain or shortness of breath.  He has been cleared by cardiology to hold his Brilinta for 5 days prior to colonoscopy.  He otherwise feels well without complaints.   Labs 6/14: Hgb 13.3, MCV 90 AP 119, AST 25, ALT 34, T bil 0.4 Iron 85, iron sat 20%, ferritin of 15.1  (low - normal 22-322) TTG IgA negative,  IgA normal INR 1.0 Alpha one antitrypsin 201 AMA (-) ANA (-) SMA level of 23 (nl < 20) Ceruloplasmin 29 Acute hep panel negative GGT elevated at 71 IgG normal at 1187  03/22/21 - AP 125, AST 24, ALT 32, T bil 0.1  RUQ [place] 02/16/21 - normal    Echo 04/02/21 - EF 60-65%, mild LVH, mild to mod MR     Past Medical History:  Diagnosis Date   Coronary artery disease    Elevated alkaline phosphatase level    Hyperlipidemia    Iron deficiency    Myocardial infarction [facility])      Past Surgical History:  Procedure Laterality Date   CORONARY STENT INTERVENTION N/A 07/02/2020   Procedure: CORONARY STENT INTERVENTION;  Surgeon: Sherren Mocha, MD;  Location: [place] [facility];  Service: Cardiovascular;  Laterality: N/A;   CORONARY/GRAFT ACUTE MI REVASCULARIZATION N/A 07/02/2020   Procedure: Coronary/Graft Acute MI Revascularization;  Surgeon: Sherren Mocha, MD;  Location: [place] [facility];  Service: Cardiovascular;  Laterality: N/A;   LEFT HEART CATH AND CORONARY ANGIOGRAPHY N/A 07/02/2020   Procedure: LEFT HEART CATH AND CORONARY ANGIOGRAPHY;  Surgeon: Sherren Mocha, MD;  Location: [place] [facility];  Service: Cardiovascular;  Laterality: N/A;   Family History  Problem Relation Age of Onset   Cancer Mother  breast ca   Heart disease Mother        MI at 20, deceased   Heart disease Father 49       complication of PNS- deceased age 15   Social History   Tobacco Use   Smoking status: Every Day    Packs/day: 1.00    Types: Cigarettes   Smokeless tobacco: Never  Vaping Use   Vaping Use: Never used  Substance Use Topics   Alcohol use: Yes    Alcohol/week: 10.0 standard drinks    Types: 10 Cans of beer per week   Drug use: No   Current Outpatient Medications  Medication Sig Dispense Refill   aspirin 81 MG EC tablet TAKE 1 TABLET (81 MG TOTAL) BY MOUTH DAILY. SWALLOW WHOLE. 30 tablet 0   atorvastatin (LIPITOR) 80 MG tablet TAKE 1 TABLET (80 MG TOTAL) BY MOUTH  DAILY. 90 tablet 1   losartan (COZAAR) 25 MG tablet TAKE 1/2 TABLET BY MOUTH DAILY. 45 tablet 1   metoprolol tartrate (LOPRESSOR) 25 MG tablet TAKE 1/2 TABLET BY MOUTH 2 TIMES DAILY. 90 tablet 1   nitroGLYCERIN (NITROSTAT) 0.4 MG SL tablet PLACE 1 TABLET (0.4 MG TOTAL) UNDER THE TONGUE EVERY FIVE MINUTES X 3 DOSES AS NEEDED FOR CHEST PAIN. 25 tablet 0   Sodium Sulfate-Mag Sulfate-KCl (SUTAB) [phone] MG TABS Take 1 kit by mouth once for 1 dose. 24 tablet 0   ticagrelor (BRILINTA) 90 MG TABS tablet TAKE 1 TABLET (90 MG TOTAL) BY MOUTH 2 (TWO) TIMES DAILY. 180 tablet 1   No current facility-administered medications for this visit.   No Known Allergies   Review of Systems: All systems reviewed and negative except where noted in HPI.   Lab Results  Component Value Date   ALT 32 03/22/2021   AST 24 03/22/2021   ALKPHOS 125 (H) 03/22/2021   BILITOT 0.4 03/22/2021     ECHOCARDIOGRAM COMPLETE  Result Date: 04/02/2021    ECHOCARDIOGRAM REPORT   Patient Name:   OLLY SHINER Date of Exam: [birth year] Medical Rec #:  [id number]       Height:       71.0 in Accession #:    [id number]      Weight:       149.8 lb Date of Birth:  [birth year]       BSA:          1.865 m Patient Age:    26 years        BP:           120/80 mmHg Patient Gender: M               HR:           77 bpm. Exam Location:  [place] Procedure: 2D Echo, 3D Echo, Cardiac Doppler, Color Doppler and Strain Analysis Indications:    R01.1 Murmur  History:        Patient has prior history of Echocardiogram examinations, most                 recent 07/02/2020. Previous Myocardial Infarction and CAD; Risk                 Factors:Dyslipidemia.  Sonographer:    Wilford Sports Rodgers-Jones RDCS Referring Phys: [place]  1. Left ventricular ejection fraction, by estimation, is 60 to 65%. Left ventricular ejection fraction by 3D volume is 63 %. The left ventricle has normal function. The left ventricle has no  regional wall  motion abnormalities. There is mild left ventricular hypertrophy. Left ventricular diastolic parameters were normal. The average left ventricular global longitudinal strain is -21.0 %.  2. Right ventricular systolic function is normal. The right ventricular size is normal. There is normal pulmonary artery systolic pressure.  3. The mitral valve is abnormal. Mild to moderate mitral valve regurgitation. There is moderate prolapse of both leaflets of the mitral valve.  4. The aortic valve is tricuspid. Aortic valve regurgitation is not visualized. No aortic stenosis is present.  5. The inferior vena cava is normal in size with greater than 50% respiratory variability, suggesting right atrial pressure of 3 mmHg. FINDINGS  Left Ventricle: Left ventricular ejection fraction, by estimation, is 60 to 65%. Left ventricular ejection fraction by 3D volume is 63 %. The left ventricle has normal function. The left ventricle has no regional wall motion abnormalities. The average left ventricular global longitudinal strain is -21.0 %. The left ventricular internal cavity size was normal in size. There is mild left ventricular hypertrophy. Left ventricular diastolic parameters were normal. Right Ventricle: The right ventricular size is normal. No increase in right ventricular wall thickness. Right ventricular systolic function is normal. There is normal pulmonary artery systolic pressure. The tricuspid regurgitant velocity is 1.83 m/s, and  with an assumed right atrial pressure of 3 mmHg, the estimated right ventricular systolic pressure is 03.5 mmHg. Left Atrium: Left atrial size was normal in size. Right Atrium: Right atrial size was normal in size. Pericardium: There is no evidence of pericardial effusion. Mitral Valve: The mitral valve is abnormal. There is moderate prolapse of both leaflets of the mitral valve. Mild to moderate mitral valve regurgitation. Tricuspid Valve: The tricuspid valve is normal in structure. Tricuspid  valve regurgitation is trivial. Aortic Valve: The aortic valve is tricuspid. Aortic valve regurgitation is not visualized. No aortic stenosis is present. Pulmonic Valve: The pulmonic valve was not well visualized. Pulmonic valve regurgitation is not visualized. Aorta: The aortic root is normal in size and structure. Venous: The inferior vena cava is normal in size with greater than 50% respiratory variability, suggesting right atrial pressure of 3 mmHg. IAS/Shunts: The interatrial septum was not well visualized.  LEFT VENTRICLE PLAX 2D LVIDd:         4.50 cm         Diastology LVIDs:         2.80 cm         LV e' medial:    7.72 cm/s LV PW:         0.90 cm         LV E/e' medial:  12.4 LV IVS:        0.80 cm         LV e' lateral:   8.05 cm/s LVOT diam:     2.10 cm         LV E/e' lateral: 11.9 LV SV:         63 LV SV Index:   34              2D LVOT Area:     3.46 cm        Longitudinal                                Strain  2D Strain GLS  -18.1 %                                (A2C):                                2D Strain GLS  -20.4 %                                (A3C):                                2D Strain GLS  -24.5 %                                (A4C):                                2D Strain GLS  -21.0 %                                Avg:                                 3D Volume EF                                LV 3D EF:    Left                                             ventricul                                             ar                                             ejection                                             fraction                                             by 3D                                             volume is  63 %.                                 3D Volume EF:                                3D EF:        63 %                                LV EDV:       113 ml                                LV ESV:        41 ml                                LV SV:        71 ml RIGHT VENTRICLE RV Basal diam:  3.70 cm RV S prime:     13.45 cm/s TAPSE (M-mode): 1.9 cm LEFT ATRIUM             Index       RIGHT ATRIUM          Index LA diam:        4.00 cm 2.14 cm/m  RA Area:     9.62 cm LA Vol (A2C):   46.0 ml 24.67 ml/m RA Volume:   21.80 ml 11.69 ml/m LA Vol (A4C):   39.8 ml 21.34 ml/m LA Biplane Vol: 43.1 ml 23.11 ml/m  AORTIC VALVE LVOT Vmax:   86.60 cm/s LVOT Vmean:  57.550 cm/s LVOT VTI:    0.182 m  AORTA Ao Root diam: 3.80 cm Ao Asc diam:  3.40 cm MITRAL VALVE                TRICUSPID VALVE MV Area (PHT): 3.27 cm     TR Peak grad:   13.4 mmHg MV Decel Time: 232 msec     TR Vmax:        183.00 cm/s MR Peak grad: 122.3 mmHg MR Mean grad: 89.0 mmHg     SHUNTS MR Vmax:      553.00 cm/s   Systemic VTI:  0.18 m MR Vmean:     452.0 cm/s    Systemic Diam: 2.10 cm MV E velocity: 95.50 cm/s MV A velocity: 108.00 cm/s MV E/A ratio:  0.88 Oswaldo Milian MD Electronically signed by Oswaldo Milian MD Signature Date/Time: 04/02/2021/11:00:28 AM    Final     Physical Exam: BP 128/76   Pulse 76   Ht 5' 11"  (1.803 m)   Wt 152 lb (68.9 kg)   BMI 21.20 kg/m  Constitutional: Pleasant,well-developed, male in no acute distress..  Cardiovascular: Normal rate, regular rhythm.  Pulmonary/chest: Effort normal and breath sounds normal.  Abdominal: Soft, nondistended, nontender.  There are no masses palpable.  Extremities: no edema Neurological: Alert and oriented to person place and time. Skin: Skin is warm and dry. No rashes noted. Psychiatric: Normal mood and affect. Behavior is normal.   ASSESSMENT AND PLAN: 65 year old male here for reassessment of following:  Iron deficiency Elevated alk phos GERD Antiplatelet use  As above, relatively mild  elevation in alk phos with normal LAE's otherwise.  He has had a relatively negative serologic work-up and right upper quadrant ultrasound and has no symptoms.  He has  occasional alcohol use but no steatosis noted on ultrasound.  We discussed options which include monitoring versus pursuing further evaluation with MRCP and/or a liver biopsy.  I discussed what each of these would entail.  Given the elevation is quite mild and just above normal, he opts for observation at this time and does not want to proceed with additional work-up as long as his liver enzymes remain stable.  I will repeat his LFTs in 3 months.  If he has a upward rise or persistence over time he may consider MRCP or liver biopsy.  Otherwise I discussed his low ferritin with him and differential for that.  He does have a history of reflux and does not take anything currently for it.  I am recommending an EGD and colonoscopy to further evaluate this.  I discussed risk benefits of the procedure and anesthesia and he wants to proceed.  He has been cleared by cardiology to hold his Brilinta for 5 days prior to the exam, he can continue the baby aspirin throughout the periprocedural time  Plan: - EGD and colonoscopy - cleared to hold Brilinta for 5 days, can continue aspirin - repeat LFTs in 3 months - negative workup and RUQ [place] - next step is MRCP or liver biopsy which he declines at this time - unclear if med related  Jolly Mango, MD [facility]

## 2021-04-17 NOTE — Patient Instructions (Addendum)
If you are age 64 or older, your body mass index should be between 23-30. Your Body mass index is 21.2 kg/m. If this is out of the aforementioned range listed, please consider follow up with your Primary Care Provider.  If you are age 24 or younger, your body mass index should be between 19-25. Your Body mass index is 21.2 kg/m. If this is out of the aformentioned range listed, please consider follow up with your Primary Care Provider.   __________________________________________________________  The [facility] providers would like to encourage you to use [facility] to communicate with providers for non-urgent requests or questions.  Due to long hold times on the telephone, sending your provider a message by [facility] may be a faster and more efficient way to get a response.  Please allow 48 business hours for a response.  Please remember that this is for non-urgent requests.    You have been scheduled for an endoscopy and colonoscopy. Please follow the written instructions given to you at your visit today. Please pick up your prep supplies at the pharmacy within the next 1-3 days. If you use inhalers (even only as needed), please bring them with you on the day of your procedure.  We have given you a no surprise estimate today for your procedure.  Please hold your Brilinta for 5 days before your procedure starting on 9-22. Continue aspirin.  You will be due for labs in 3 months.  We will remind you when it is time to go to the lab.  Thank you for entrusting me with your care and for choosing [facility], Dr. Ileene Patrick

## 2021-04-25 ENCOUNTER — Other Ambulatory Visit: Payer: Self-pay

## 2021-04-25 ENCOUNTER — Telehealth: Payer: Self-pay | Admitting: Cardiovascular Disease

## 2021-04-25 MED ORDER — NITROGLYCERIN 0.4 MG SL SUBL
SUBLINGUAL_TABLET | SUBLINGUAL | 3 refills | Status: DC | PRN
  Filled 2021-04-25: qty 25, 25d supply, fill #0

## 2021-04-25 NOTE — Telephone Encounter (Signed)
*  STAT* If patient is at the pharmacy, call can be transferred to refill team.   1. Which medications need to be refilled? (please list name of each medication and dose if known)  nitroGLYCERIN (NITROSTAT) 0.4 MG SL tablet  2. Which pharmacy/location (including street and city if local pharmacy) is medication to be sent to?  [facility]  3. Do they need a 30 day or 90 day supply? 30 day supply

## 2021-04-25 NOTE — Telephone Encounter (Signed)
Sent to pharmacy of choice.

## 2021-04-27 ENCOUNTER — Other Ambulatory Visit: Payer: Self-pay

## 2021-05-01 ENCOUNTER — Other Ambulatory Visit: Payer: Self-pay

## 2021-05-02 ENCOUNTER — Other Ambulatory Visit: Payer: Self-pay

## 2021-05-18 ENCOUNTER — Telehealth: Payer: Self-pay

## 2021-05-18 NOTE — Telephone Encounter (Signed)
Hi Dr. Adela Lank,  This patient cancelled his procedure on 05/22/21 at 3:30pm. Stated that he will still be out of town for work and is not sure when he is returning. Will call office to reschedule at a later time. Recall placed for one month.

## 2021-05-20 NOTE — Telephone Encounter (Signed)
Okay thanks for letting me know

## 2021-05-22 ENCOUNTER — Encounter: Payer: Self-pay | Admitting: Gastroenterology

## 2021-05-31 ENCOUNTER — Other Ambulatory Visit: Payer: Self-pay

## 2021-07-02 ENCOUNTER — Other Ambulatory Visit: Payer: Self-pay

## 2021-07-02 DIAGNOSIS — R7989 Other specified abnormal findings of blood chemistry: Secondary | ICD-10-CM

## 2021-07-02 NOTE — Progress Notes (Signed)
Called and spoke to patient and asked that he go to the lab for LFTs. He agreed to go to the lab one day this week

## 2021-07-17 ENCOUNTER — Other Ambulatory Visit (INDEPENDENT_AMBULATORY_CARE_PROVIDER_SITE_OTHER): Payer: Self-pay

## 2021-07-17 DIAGNOSIS — R7989 Other specified abnormal findings of blood chemistry: Secondary | ICD-10-CM

## 2021-07-17 LAB — HEPATIC FUNCTION PANEL
ALT: 26 U/L (ref 0–53)
AST: 22 U/L (ref 0–37)
Albumin: 4.1 g/dL (ref 3.5–5.2)
Alkaline Phosphatase: 114 U/L (ref 39–117)
Bilirubin, Direct: 0.1 mg/dL (ref 0.0–0.3)
Total Bilirubin: 0.5 mg/dL (ref 0.2–1.2)
Total Protein: 6.9 g/dL (ref 6.0–8.3)

## 2021-07-24 DIAGNOSIS — I340 Nonrheumatic mitral (valve) insufficiency: Secondary | ICD-10-CM | POA: Insufficient documentation

## 2021-07-24 DIAGNOSIS — E782 Mixed hyperlipidemia: Secondary | ICD-10-CM | POA: Insufficient documentation

## 2021-07-24 NOTE — Progress Notes (Signed)
Cardiology Office Note:    Date:  07/25/2021   ID:  Tomi Bamberger, DOB [birth year], MRN [id number]  PCP:  Patient, No Pcp Per (Inactive)   [facility] Providers Cardiologist:  Tonny Bollman, MD Cardiology APP:  Kennon Rounds    Referring MD: No ref. provider found   Chief Complaint:  F/u for CAD    Patient Profile:   Eddie Joseph is a 65 y.o. 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 Excell Seltzer in 7/22.  He noted that the patient could likely DC Ticagrelor at 1 year out from his MI.  He had a murmur on exam and an echocardiogram demonstrated MVP with mild to mod MR and normal EF.  He returns for f/u.  He is here alone.  Overall, he has been doing well.  He has an occasional substernal chest pain that lasts seconds.  It is not related to exertion.  He has not had to take nitroglycerin.  It does not remind him of his anginal symptoms.  He has chronic shortness of breath related to smoking.  This is unchanged.  He has not had orthopnea, leg edema, syncope      ASSESSMENT & PLAN:   Coronary artery disease S/p inferior STEMI in 11/21 treated with DES to the RCA.  He has residual disease with 50% ostial and 70% mid LAD stenosis.  Overall, he is doing well without anginal symptoms.  He has occasional chest discomfort that sounds noncardiac.  It has been greater than 1 year since his myocardial infarction.  He can now stop ticagrelor.  Continue aspirin 81 mg daily, metoprolol tartrate 12.5 mg twice daily, atorvastatin 80 mg daily.  Follow-up in 6 months.  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  He will likely need follow-up  echocardiogram in the next 12 to 24 months.  Mixed hyperlipidemia LDL optimal in January of this year.  Continue atorvastatin 80 mg daily.  Tobacco abuse He still smokes an occasional cigarette.  We discussed the importance of complete cessation.          Dispo:  Return in about 6 months (around 01/22/2022) for Routine Follow Up, w/ Dr. Excell Seltzer, or Tereso Newcomer, PA-C.    Prior CV studies: Echocardiogram 04/02/21 EF 60-65, no RWMA. Mild LVH, GLS -21.0%, normal RVSF, nl PASP, mod MVP with mild to mod MR  Echocardiogram 07/02/20 EF 50-55, no RWMA, normal RVSF   Cardiac catheterization 07/02/20 LAD ost 50, mid 70 LCx irregs RCA prox 100, thrombotic  EF > 65 PCI: 3.5 x 38 mm Resolute Onyx DES to pRCA      Past Medical History:  Diagnosis Date   Coronary artery disease    Elevated alkaline phosphatase level    Hyperlipidemia    Iron deficiency    Myocardial infarction (HCC)    Current Medications: Current Meds  Medication Sig   atorvastatin (LIPITOR) 80 MG tablet TAKE 1 TABLET (80 MG TOTAL) BY MOUTH DAILY.   losartan (COZAAR) 25 MG tablet TAKE 1/2 TABLET BY MOUTH DAILY.   metoprolol tartrate (LOPRESSOR) 25 MG tablet TAKE 1/2 TABLET BY MOUTH 2 TIMES DAILY.  ticagrelor (BRILINTA) 90 MG TABS tablet TAKE 1 TABLET (90 MG TOTAL) BY MOUTH 2 (TWO) TIMES DAILY.   [DISCONTINUED] nitroGLYCERIN (NITROSTAT) 0.4 MG SL tablet PLACE 1 TABLET (0.4 MG TOTAL) UNDER THE TONGUE EVERY FIVE MINUTES X 3 DOSES AS NEEDED FOR CHEST PAIN.    Allergies:   Patient has no known allergies.   Social History   Tobacco Use   Smoking status: Every Day    Packs/day: 1.00    Types: Cigarettes   Smokeless tobacco: Never  Vaping Use   Vaping Use: Never used  Substance Use Topics   Alcohol use: Yes    Alcohol/week: 10.0 standard drinks    Types: 10 Cans of beer per week   Drug use: No    Family Hx: The patient's family history includes Cancer in his mother; Heart disease in his mother; Heart disease (age  of onset: 82) in his father.  Review of Systems  Gastrointestinal:  Negative for hematochezia.  Genitourinary:  Negative for hematuria.    EKGs/Labs/Other Test Reviewed:    EKG:  EKG is not ordered today.  The ekg ordered today demonstrates n/a  Recent Labs: 02/06/2021: BUN 18; Creatinine, Ser 0.96; Hemoglobin 13.3; Platelets 257.0; Potassium 4.4; Sodium 139 07/17/2021: ALT 26   Recent Lipid Panel Lab Results  Component Value Date/Time   CHOL 124 09/25/2020 09:46 AM   TRIG 102 09/25/2020 09:46 AM   HDL 40 09/25/2020 09:46 AM   LDLCALC 65 09/25/2020 09:46 AM     Risk Assessment/Calculations:          Physical Exam:    VS:  BP 110/62   Pulse 71   Ht 5\' 11"  (1.803 m)   Wt 157 lb 6.4 oz (71.4 kg)   SpO2 96%   BMI 21.95 kg/m     Wt Readings from Last 3 Encounters:  07/25/21 157 lb 6.4 oz (71.4 kg)  04/17/21 152 lb (68.9 kg)  03/19/21 149 lb 12.8 oz (67.9 kg)    Constitutional:      Appearance: Healthy appearance. Not in distress.  Neck:     Vascular: No JVR. JVD normal.  Pulmonary:     Effort: Pulmonary effort is normal.     Breath sounds: No wheezing. No rales.  Cardiovascular:     Normal rate. Regular rhythm. Normal S1. Normal S2.      Murmurs: There is a grade 2/6 midsystolic murmur at the LLSB.  Edema:    Peripheral edema absent.  Abdominal:     Palpations: Abdomen is soft.  Skin:    General: Skin is warm and dry.  Neurological:     General: No focal deficit present.     Mental Status: Alert and oriented to person, place and time.     Cranial Nerves: Cranial nerves are intact.       Medication Adjustments/Labs and Tests Ordered: Current medicines are reviewed at length with the patient today.  Concerns regarding medicines are outlined above.  Tests Ordered: No orders of the defined types were placed in this encounter.  Medication Changes: Meds ordered this encounter  Medications   nitroGLYCERIN (NITROSTAT) 0.4 MG SL tablet    Sig: PLACE 1 TABLET  (0.4 MG TOTAL) UNDER THE TONGUE EVERY FIVE MINUTES X 3 DOSES AS NEEDED FOR CHEST PAIN.    Dispense:  25 tablet    Refill:  5   Signed, [name], PA-C  07/25/2021 8:53 AM    [facility] [place],  [place]  [address] Phone: [phone]; Fax: [phone]

## 2021-07-25 ENCOUNTER — Other Ambulatory Visit: Payer: Self-pay

## 2021-07-25 ENCOUNTER — Encounter: Payer: Self-pay | Admitting: Physician Assistant

## 2021-07-25 ENCOUNTER — Ambulatory Visit (INDEPENDENT_AMBULATORY_CARE_PROVIDER_SITE_OTHER): Payer: Self-pay | Admitting: Physician Assistant

## 2021-07-25 VITALS — BP 110/62 | HR 71 | Ht 71.0 in | Wt 157.4 lb

## 2021-07-25 DIAGNOSIS — I340 Nonrheumatic mitral (valve) insufficiency: Secondary | ICD-10-CM

## 2021-07-25 DIAGNOSIS — I2510 Atherosclerotic heart disease of native coronary artery without angina pectoris: Secondary | ICD-10-CM

## 2021-07-25 DIAGNOSIS — Z720 Tobacco use: Secondary | ICD-10-CM

## 2021-07-25 DIAGNOSIS — E782 Mixed hyperlipidemia: Secondary | ICD-10-CM

## 2021-07-25 DIAGNOSIS — I341 Nonrheumatic mitral (valve) prolapse: Secondary | ICD-10-CM

## 2021-07-25 MED ORDER — NITROGLYCERIN 0.4 MG SL SUBL
SUBLINGUAL_TABLET | SUBLINGUAL | 5 refills | Status: DC | PRN
  Filled 2021-07-25: qty 25, 10d supply, fill #0

## 2021-07-25 NOTE — Assessment & Plan Note (Signed)
He still smokes an occasional cigarette.  We discussed the importance of complete cessation.

## 2021-07-25 NOTE — Assessment & Plan Note (Signed)
We discussed the findings of his recent echocardiogram.  He will likely need follow-up echocardiogram in the next 12 to 24 months.

## 2021-07-25 NOTE — Assessment & Plan Note (Signed)
S/p inferior STEMI in 11/21 treated with DES to the RCA.  He has residual disease with 50% ostial and 70% mid LAD stenosis.  Overall, he is doing well without anginal symptoms.  He has occasional chest discomfort that sounds noncardiac.  It has been greater than 1 year since his myocardial infarction.  He can now stop ticagrelor.  Continue aspirin 81 mg daily, metoprolol tartrate 12.5 mg twice daily, atorvastatin 80 mg daily.  Follow-up in 6 months.

## 2021-07-25 NOTE — Patient Instructions (Signed)
Medication Instructions:  1.Finish the supply of Brilinta that you have on hand and then stop taking it *If you need a refill on your cardiac medications before your next appointment, please call your pharmacy*   Lab Work: None If you have labs (blood work) drawn today and your tests are completely normal, you will receive your results only by: MyChart Message (if you have MyChart) OR A paper copy in the mail If you have any lab test that is abnormal or we need to change your treatment, we will call you to review the results.   Testing/Procedures: None   Follow-Up: At [facility], you and your health needs are our priority.  As part of our continuing mission to provide you with exceptional heart care, we have created designated Provider Care Teams.  These Care Teams include your primary Cardiologist (physician) and Advanced Practice Providers (APPs -  Physician Assistants and Nurse Practitioners) who all work together to provide you with the care you need, when you need it.  We recommend signing up for the patient portal called "MyChart".  Sign up information is provided on this After Visit Summary.  MyChart is used to connect with patients for Virtual Visits (Telemedicine).  Patients are able to view lab/test results, encounter notes, upcoming appointments, etc.  Non-urgent messages can be sent to your provider as well.   To learn more about what you can do with MyChart, go to [email or web address].    Your next appointment:   6 month(s)  The format for your next appointment:   In Person  Provider:   Tonny Bollman, MD  or Tereso Newcomer, PA-C

## 2021-07-25 NOTE — Assessment & Plan Note (Signed)
LDL optimal in January of this year.  Continue atorvastatin 80 mg daily.

## 2021-07-31 ENCOUNTER — Other Ambulatory Visit: Payer: Self-pay

## 2021-09-03 ENCOUNTER — Other Ambulatory Visit: Payer: Self-pay

## 2021-09-03 MED ORDER — ATORVASTATIN CALCIUM 80 MG PO TABS
ORAL_TABLET | Freq: Every day | ORAL | 3 refills | Status: DC
  Filled 2021-09-03: qty 30, 30d supply, fill #0
  Filled 2021-10-04: qty 30, 30d supply, fill #1
  Filled 2021-11-02: qty 30, 30d supply, fill #2
  Filled 2021-12-06: qty 30, 30d supply, fill #3
  Filled 2022-01-04: qty 30, 30d supply, fill #4
  Filled 2022-02-04: qty 30, 30d supply, fill #5
  Filled 2022-03-05: qty 30, 30d supply, fill #6
  Filled 2022-04-05: qty 30, 30d supply, fill #7
  Filled 2022-05-09: qty 30, 30d supply, fill #8
  Filled 2022-06-04: qty 90, 90d supply, fill #9

## 2021-09-03 MED ORDER — METOPROLOL TARTRATE 25 MG PO TABS
ORAL_TABLET | ORAL | 3 refills | Status: DC
  Filled 2021-09-03: qty 30, 30d supply, fill #0
  Filled 2021-10-04: qty 30, 30d supply, fill #1
  Filled 2021-11-02: qty 30, 30d supply, fill #2
  Filled 2021-12-06: qty 30, 30d supply, fill #3
  Filled 2022-01-04: qty 30, 30d supply, fill #4
  Filled 2022-02-04: qty 30, 30d supply, fill #5
  Filled 2022-03-05: qty 30, 30d supply, fill #6
  Filled 2022-04-05: qty 30, 30d supply, fill #7
  Filled 2022-05-09: qty 30, 30d supply, fill #8
  Filled 2022-06-04: qty 90, 90d supply, fill #9

## 2021-09-03 MED ORDER — LOSARTAN POTASSIUM 25 MG PO TABS
ORAL_TABLET | Freq: Every day | ORAL | 3 refills | Status: DC
  Filled 2021-09-03: qty 15, 30d supply, fill #0
  Filled 2021-10-04: qty 15, 30d supply, fill #1
  Filled 2021-11-02: qty 15, 30d supply, fill #2
  Filled 2021-12-06: qty 15, 30d supply, fill #3
  Filled 2022-01-04: qty 15, 30d supply, fill #4
  Filled 2022-02-04: qty 15, 30d supply, fill #5
  Filled 2022-03-05: qty 15, 30d supply, fill #6
  Filled 2022-04-05 (×2): qty 15, 30d supply, fill #7
  Filled 2022-05-09: qty 15, 30d supply, fill #8
  Filled 2022-06-04: qty 45, 90d supply, fill #9

## 2021-09-03 NOTE — Telephone Encounter (Signed)
Pt's medication was sent to pt's pharmacy as requested. Confirmation received.  °

## 2021-09-04 ENCOUNTER — Other Ambulatory Visit: Payer: Self-pay

## 2021-10-04 ENCOUNTER — Telehealth: Payer: Self-pay | Admitting: Cardiology

## 2021-10-04 ENCOUNTER — Other Ambulatory Visit: Payer: Self-pay

## 2021-10-04 NOTE — Telephone Encounter (Signed)
Patient called in with concern for swelling and pain in his index finger. Reports cutting his finger several days ago and now finger is swollen, he is unable to bend his finger. Also with swelling into the hand, along with redness. Hx of blister in his palm which ruptured requiring antibiotics in the past. Advised he should see his PCP, he states does not have one. Then advised to seek care at [facility], he is agreeable to do so.

## 2021-11-02 ENCOUNTER — Other Ambulatory Visit: Payer: Self-pay

## 2021-11-11 IMAGING — US US ABDOMEN LIMITED
1 series · 14 of 25 positions shown · non-contrast
Comparison: 02/11/2014

CLINICAL DATA: Elevated liver function tests

EXAM:
ULTRASOUND ABDOMEN LIMITED RIGHT UPPER QUADRANT

[Series 1: us abdomen limited · 14 of 62 slices shown]
[im 1/62]
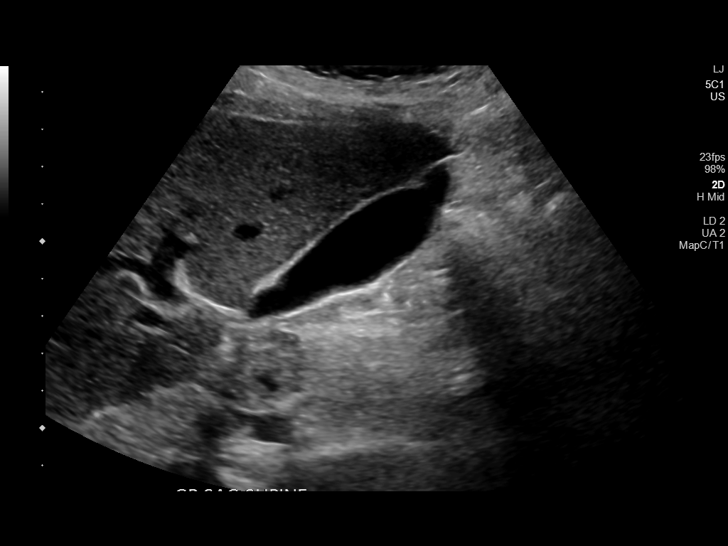
[im 6/62]
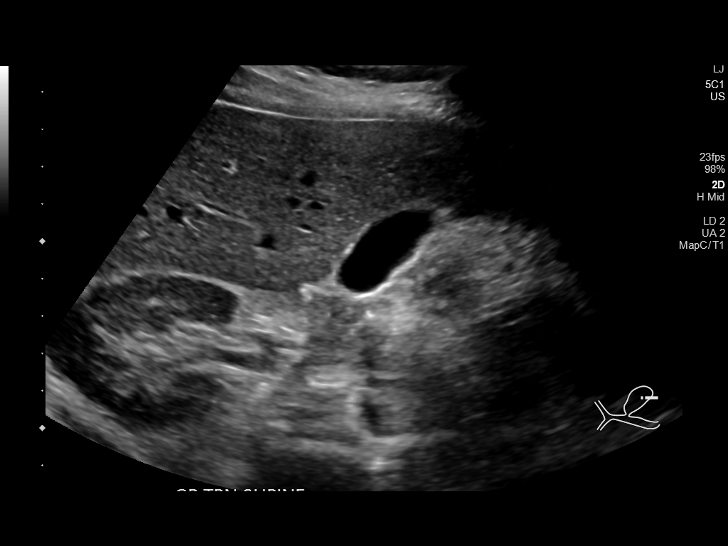
[im 11/62]
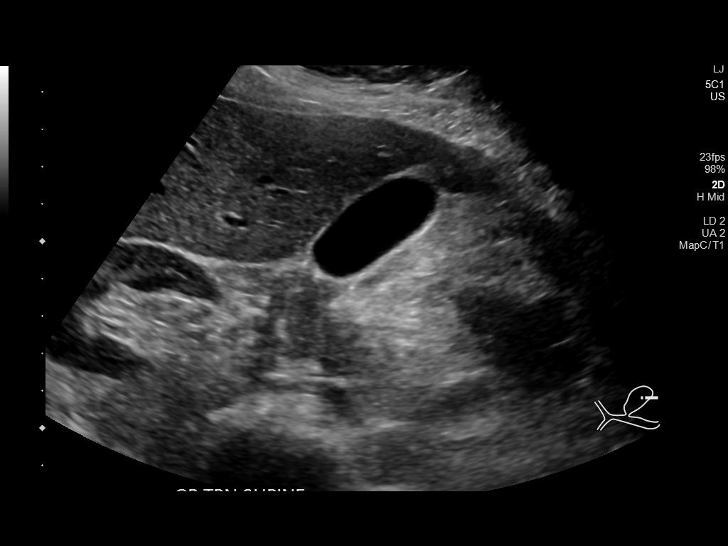
[im 16/62]
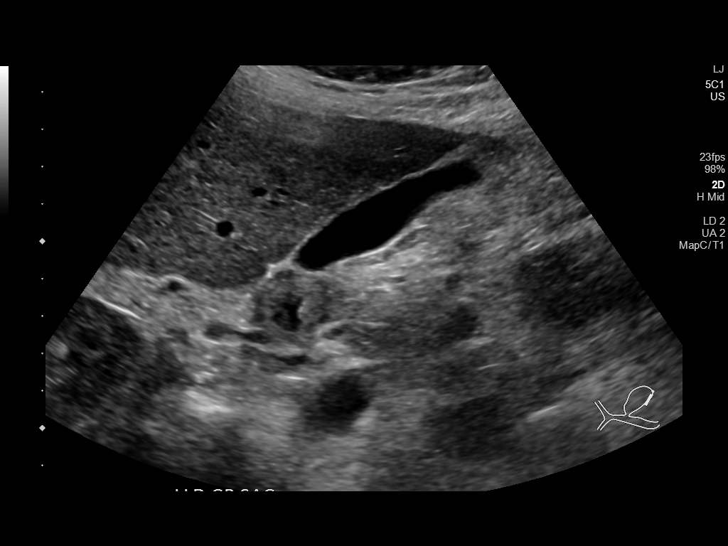
[im 21/62]
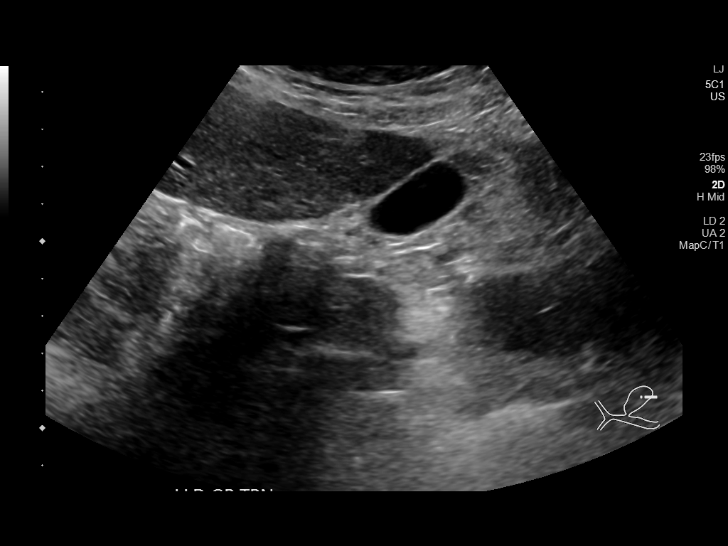
[im 23/62]
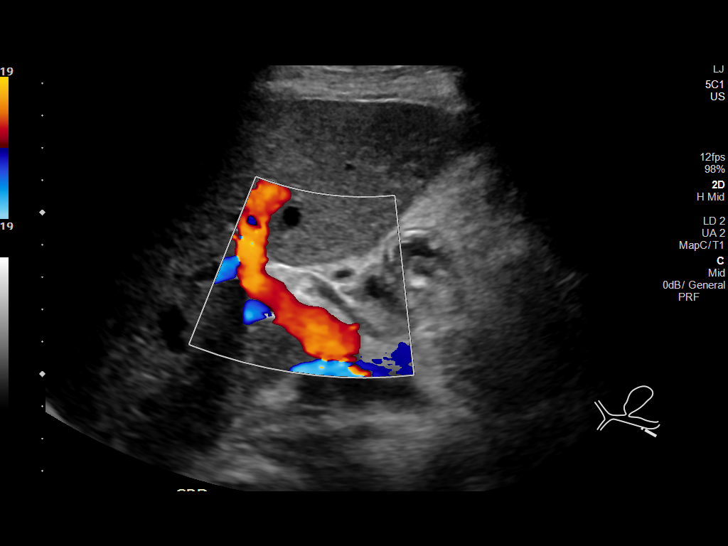
[im 28/62]
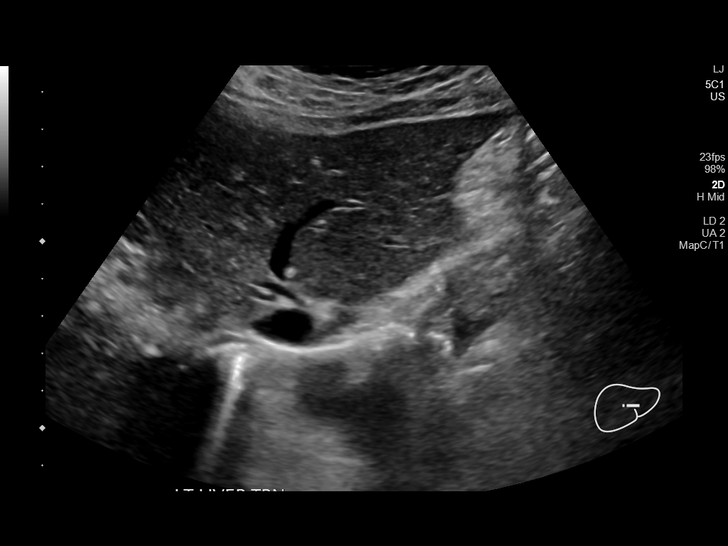
[im 34/62]
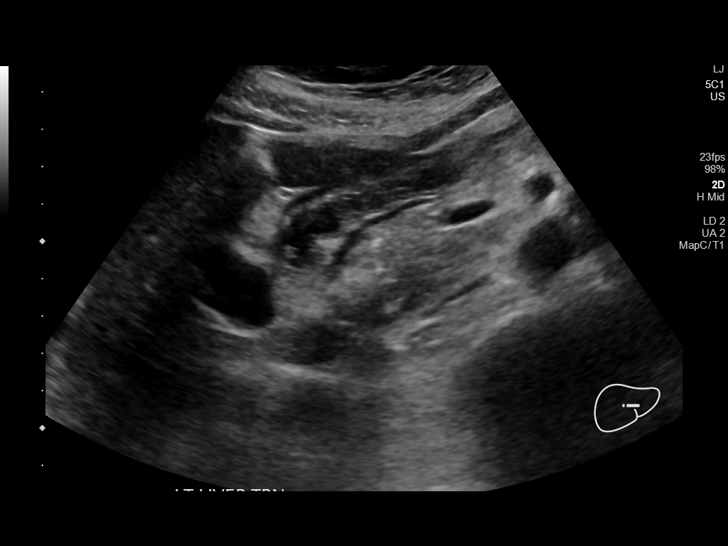
[im 39/62]
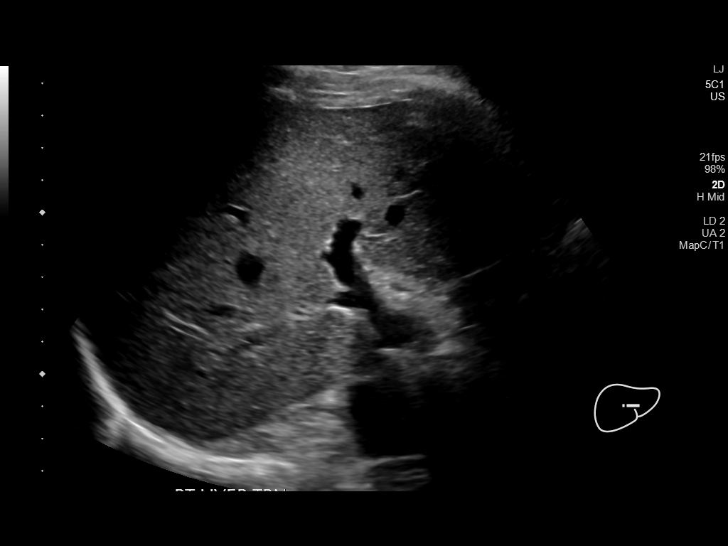
[im 41/62]
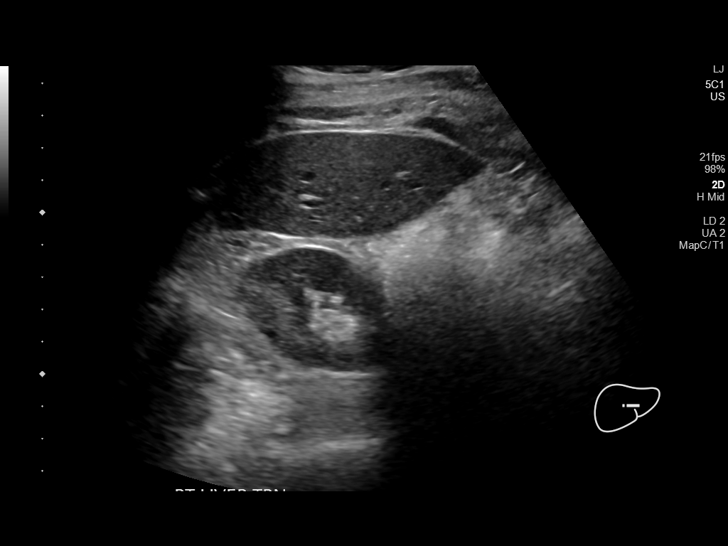
[im 46/62]
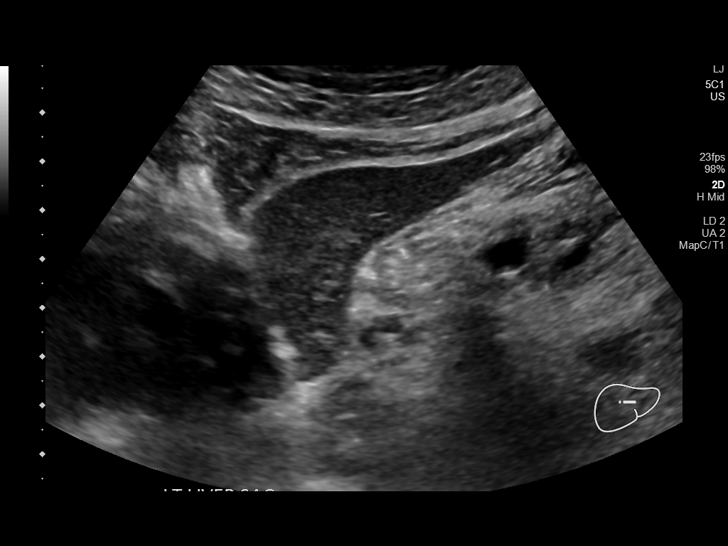
[im 51/62]
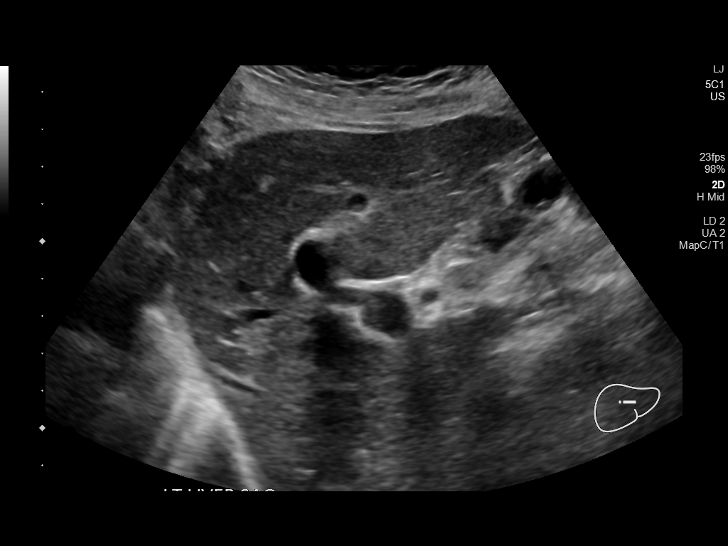
[im 56/62]
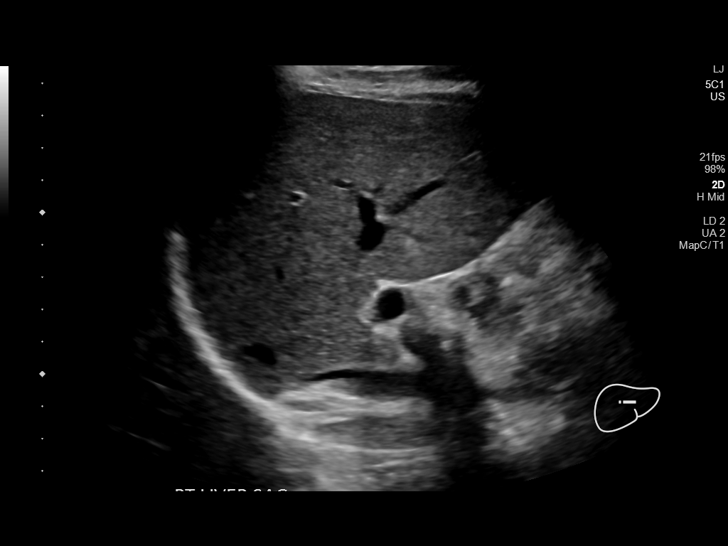
[im 62/62]
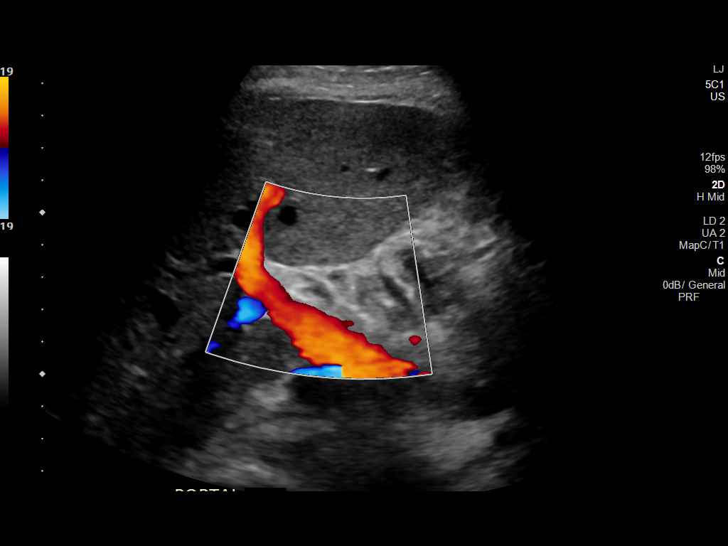

[14 of 25 positions shown; findings below may reference images not displayed]

FINDINGS: Gallbladder:

No gallstones or wall thickening visualized. No sonographic Murphy
sign noted by sonographer.

Common bile duct:

Diameter: 4 mm

Liver:

No focal lesion identified. Within normal limits in parenchymal
echogenicity. Portal vein is patent on color Doppler imaging with
normal direction of blood flow towards the liver.

Other: None.
IMPRESSION: 1. Unremarkable right upper quadrant ultrasound.

## 2021-12-06 ENCOUNTER — Other Ambulatory Visit: Payer: Self-pay

## 2022-01-04 ENCOUNTER — Other Ambulatory Visit: Payer: Self-pay

## 2022-02-04 ENCOUNTER — Other Ambulatory Visit: Payer: Self-pay

## 2022-02-14 ENCOUNTER — Ambulatory Visit: Payer: Self-pay | Admitting: Cardiovascular Disease

## 2022-03-05 ENCOUNTER — Other Ambulatory Visit: Payer: Self-pay

## 2022-03-11 ENCOUNTER — Encounter: Payer: Self-pay | Admitting: Cardiovascular Disease

## 2022-03-11 ENCOUNTER — Ambulatory Visit (INDEPENDENT_AMBULATORY_CARE_PROVIDER_SITE_OTHER): Payer: Self-pay | Admitting: Cardiovascular Disease

## 2022-03-11 VITALS — BP 110/70 | HR 75 | Ht 71.0 in | Wt 155.0 lb

## 2022-03-11 DIAGNOSIS — E782 Mixed hyperlipidemia: Secondary | ICD-10-CM

## 2022-03-11 DIAGNOSIS — I341 Nonrheumatic mitral (valve) prolapse: Secondary | ICD-10-CM

## 2022-03-11 DIAGNOSIS — I2510 Atherosclerotic heart disease of native coronary artery without angina pectoris: Secondary | ICD-10-CM

## 2022-03-11 DIAGNOSIS — I493 Ventricular premature depolarization: Secondary | ICD-10-CM

## 2022-03-11 DIAGNOSIS — Z720 Tobacco use: Secondary | ICD-10-CM

## 2022-03-11 DIAGNOSIS — I340 Nonrheumatic mitral (valve) insufficiency: Secondary | ICD-10-CM

## 2022-03-11 NOTE — Patient Instructions (Signed)
Medication Instructions:  Your physician recommends that you continue on your current medications as directed. Please refer to the Current Medication list given to you today.  *If you need a refill on your cardiac medications before your next appointment, please call your pharmacy*   Lab Work: CBC, CMET, Lipids, Magnesium  If you have labs (blood work) drawn today and your tests are completely normal, you will receive your results only by: MyChart Message (if you have MyChart) OR A paper copy in the mail If you have any lab test that is abnormal or we need to change your treatment, we will call you to review the results.   Testing/Procedures: NONE   Follow-Up: At [facility], you and your health needs are our priority.  As part of our continuing mission to provide you with exceptional heart care, we have created designated Provider Care Teams.  These Care Teams include your primary Cardiologist (physician) and Advanced Practice Providers (APPs -  Physician Assistants and Nurse Practitioners) who all work together to provide you with the care you need, when you need it.  We recommend signing up for the patient portal called "MyChart".  Sign up information is provided on this After Visit Summary.  MyChart is used to connect with patients for Virtual Visits (Telemedicine).  Patients are able to view lab/test results, encounter notes, upcoming appointments, etc.  Non-urgent messages can be sent to your provider as well.   To learn more about what you can do with MyChart, go to [email or web address].    Your next appointment:   1 year(s)  The format for your next appointment:   In Person  Provider:   Tonny Bollman, MD      Important Information About Sugar

## 2022-03-11 NOTE — Progress Notes (Signed)
Cardiology Office Note:    Date:  03/11/2022   ID:  Eddie Joseph, DOB 05-Jul-1956, MRN [id number]  PCP:  Patient, No Pcp Per   [facility] Providers Cardiologist:  Tonny Bollman, MD Cardiology APP:  Kennon Rounds     Referring MD: No ref. provider found   Chief Complaint  Patient presents with   Coronary Artery Disease    History of Present Illness:    Eddie Joseph is a 66 y.o. 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  He works in [employer] and is physically active with his job.  He denies chest pain, chest pressure, edema, orthopnea, PND, heart palpitations, lightheadedness, or syncope.  He does have mild exertional dyspnea which is longstanding.  He has no other complaints today.  He continues to smoke cigarettes but much less than he used to, now smoking 1 to 2 cigarettes every few days per his report.  Past Medical History:  Diagnosis Date   Coronary artery disease    Elevated alkaline phosphatase level    Hyperlipidemia    Iron deficiency    Myocardial infarction [facility])     Past Surgical History:  Procedure Laterality Date   CORONARY STENT INTERVENTION N/A 07/02/2020   Procedure: CORONARY STENT INTERVENTION;  Surgeon: Tonny Bollman, MD;  Location: [facility] INVASIVE CV LAB;  Service: Cardiovascular;  Laterality: N/A;   CORONARY/GRAFT ACUTE MI REVASCULARIZATION N/A 07/02/2020   Procedure: Coronary/Graft Acute MI Revascularization;  Surgeon: Tonny Bollman, MD;  Location: [facility] INVASIVE CV LAB;  Service: Cardiovascular;  Laterality: N/A;   LEFT HEART CATH AND CORONARY ANGIOGRAPHY N/A 07/02/2020   Procedure: LEFT HEART CATH AND CORONARY ANGIOGRAPHY;   Surgeon: Tonny Bollman, MD;  Location: [facility] INVASIVE CV LAB;  Service: Cardiovascular;  Laterality: N/A;    Current Medications: Current Meds  Medication Sig   aspirin EC 81 MG tablet Take 81 mg by mouth daily. Swallow whole.   atorvastatin (LIPITOR) 80 MG tablet TAKE 1 TABLET (80 MG TOTAL) BY MOUTH DAILY.   losartan (COZAAR) 25 MG tablet TAKE 1/2 TABLET BY MOUTH DAILY.   metoprolol tartrate (LOPRESSOR) 25 MG tablet TAKE 1/2 TABLET BY MOUTH 2 TIMES DAILY.   nitroGLYCERIN (NITROSTAT) 0.4 MG SL tablet PLACE 1 TABLET (0.4 MG TOTAL) UNDER THE TONGUE EVERY FIVE MINUTES FOR 3 DOSES AS NEEDED FOR CHEST PAIN.     Allergies:   Patient has no known allergies.   Social History   Socioeconomic History   Marital status: Single    Spouse name: Not on file   Number of children: Not on file   Years of education: Not on file   Highest education level: Not on file  Occupational History   Not on file  Tobacco Use   Smoking status: Every Day    Packs/day: 1.00    Types: Cigarettes   Smokeless tobacco: Never  Vaping Use   Vaping Use: Never used  Substance and Sexual Activity   Alcohol use: Yes    Alcohol/week: 10.0 standard drinks of alcohol    Types: 10 Cans of beer per week   Drug use: No   Sexual activity: Not on file  Other Topics Concern  Not on file  Social History Narrative   Not on file   Social Determinants of Health   Financial Resource Strain: Not on file  Food Insecurity: Not on file  Transportation Needs: Not on file  Physical Activity: Not on file  Stress: Not on file  Social Connections: Not on file     Family History: The patient's family history includes Cancer in his mother; Heart disease in his mother; Heart disease (age of onset: 15) in his father.  ROS:   Please see the history of present illness.     All other systems reviewed and are negative.  EKGs/Labs/Other Studies Reviewed:    The following studies were reviewed today: Echo 04/02/21:  1. Left  ventricular ejection fraction, by estimation, is 60 to 65%. Left  ventricular ejection fraction by 3D volume is 63 %. The left ventricle has  normal function. The left ventricle has no regional wall motion  abnormalities. There is mild left  ventricular hypertrophy. Left ventricular diastolic parameters were  normal. The average left ventricular global longitudinal strain is -21.0  %.   2. Right ventricular systolic function is normal. The right ventricular  size is normal. There is normal pulmonary artery systolic pressure.   3. The mitral valve is abnormal. Mild to moderate mitral valve  regurgitation. There is moderate prolapse of both leaflets of the mitral  valve.   4. The aortic valve is tricuspid. Aortic valve regurgitation is not  visualized. No aortic stenosis is present.   5. The inferior vena cava is normal in size with greater than 50%  respiratory variability, suggesting right atrial pressure of 3 mmHg.  Cardiac Cath 07/02/2020: 1.  Total occlusion of the proximal RCA, treated successfully with primary PCI using a 3.5 x 38 mm resolute Onyx DES 2.  Moderate ostial and proximal LAD stenoses, 50 to 70% range luminal narrowing 3.  Patent left main and left circumflex with minimal luminal irregularities 4.  Normal LV systolic function with normal LVEDP 5.  Cardiogenic shock treated with vasopressor therapy and reperfusion therapy   Recommendations: ICU care, dual antiplatelet therapy with aspirin and ticagrelor at least 12 months (ticagrelor 180 mg administered orally at the end of the procedure), tirofiban x6 hours, low-dose norepinephrine infusion at completion of procedure titrate to MAP greater than 65 or SBP greater than 90, IV amiodarone for treatment of atrial fibrillation  Diagnostic Dominance: Right  Intervention    EKG:  EKG is ordered today.  The ekg ordered today demonstrates normal sinus rhythm with PVCs in a pattern of ventricular trigeminy.  Rightward axis.   Otherwise normal.  Recent Labs: 07/17/2021: ALT 26  Recent Lipid Panel    Component Value Date/Time   CHOL 124 09/25/2020 0946   TRIG 102 09/25/2020 0946   HDL 40 09/25/2020 0946   CHOLHDL 3.1 09/25/2020 0946   CHOLHDL 8.0 07/03/2020 0430   VLDL 39 07/03/2020 0430   LDLCALC 65 09/25/2020 0946     Risk Assessment/Calculations:           Physical Exam:    VS:  BP 110/70   Pulse 75   Ht 5\' 11"  (1.803 m)   Wt 155 lb (70.3 kg)   SpO2 95%   BMI 21.62 kg/m     Wt Readings from Last 3 Encounters:  03/11/22 155 lb (70.3 kg)  07/25/21 157 lb 6.4 oz (71.4 kg)  04/17/21 152 lb (68.9 kg)     GEN:  Well nourished, well developed in no  acute distress HEENT: Normal NECK: No JVD; No carotid bruits LYMPHATICS: No lymphadenopathy CARDIAC: RRR, 2/6 mid-to-late systolic murmur at the apex unchanged from prior exam RESPIRATORY:  Clear to auscultation without rales, wheezing or rhonchi  ABDOMEN: Soft, non-tender, non-distended MUSCULOSKELETAL:  No edema; No deformity  SKIN: Warm and dry NEUROLOGIC:  Alert and oriented x 3 PSYCHIATRIC:  Normal affect   ASSESSMENT:    1. Coronary artery disease involving native coronary artery of native heart without angina pectoris   2. Mixed hyperlipidemia   3. Tobacco abuse   4. 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 PVC's (premature ventricular contractions)    PLAN:    In order of problems listed above:  The patient is currently stable without symptoms of angina.  He is treated with antiplatelet therapy using aspirin 81 mg daily, high intensity statin drug, and a beta-blocker. Continue atorvastatin 80 mg daily.  Labs reviewed with an LDL of 65 back in January 1610.  He is due for repeat lipids and I will draw these when we set him up for fasting labs in the near future.  He will continue his current medication. He is only smoking a few cigarettes, but I reviewed the importance of complete cessation with him.  He is really having a hard time quitting  completely. Stable by exam.  Last year's echo reviewed.  Asymptomatic.  Continue to follow. Patient with ventricular trigeminy today.  Will check electrolyte panel to include a magnesium level when he comes in for labs in the near future.  We know that he has normal LV function based on his echo last year and he is having no symptoms related to this.           Medication Adjustments/Labs and Tests Ordered: Current medicines are reviewed at length with the patient today.  Concerns regarding medicines are outlined above.  No orders of the defined types were placed in this encounter.  No orders of the defined types were placed in this encounter.   There are no Patient Instructions on file for this visit.   Signed, Tonny Bollman, MD  03/11/2022 12:09 PM    [facility]

## 2022-03-14 ENCOUNTER — Other Ambulatory Visit: Payer: Self-pay

## 2022-03-15 LAB — COMPREHENSIVE METABOLIC PANEL
ALT: 24 IU/L (ref 0–44)
AST: 22 IU/L (ref 0–40)
Albumin/Globulin Ratio: 1.7 (ref 1.2–2.2)
Albumin: 4.3 g/dL (ref 3.9–4.9)
Alkaline Phosphatase: 114 IU/L (ref 44–121)
BUN/Creatinine Ratio: 17 (ref 10–24)
BUN: 17 mg/dL (ref 8–27)
Bilirubin Total: 0.4 mg/dL (ref 0.0–1.2)
CO2: 23 mmol/L (ref 20–29)
Calcium: 9.1 mg/dL (ref 8.6–10.2)
Chloride: 103 mmol/L (ref 96–106)
Creatinine, Ser: 1.01 mg/dL (ref 0.76–1.27)
Globulin, Total: 2.5 g/dL (ref 1.5–4.5)
Glucose: 97 mg/dL (ref 70–99)
Potassium: 4.6 mmol/L (ref 3.5–5.2)
Sodium: 139 mmol/L (ref 134–144)
Total Protein: 6.8 g/dL (ref 6.0–8.5)
eGFR: 83 mL/min/{1.73_m2} (ref >59)

## 2022-03-15 LAB — LIPID PANEL
Chol/HDL Ratio: 3.6 ratio (ref 0.0–5.0)
Cholesterol, Total: 125 mg/dL (ref 100–199)
HDL: 35 mg/dL — ABNORMAL LOW (ref >39)
LDL Chol Calc (NIH): 69 mg/dL (ref 0–99)
Triglycerides: 117 mg/dL (ref 0–149)
VLDL Cholesterol Cal: 21 mg/dL (ref 5–40)

## 2022-03-15 LAB — CBC
Hematocrit: 43.7 % (ref 37.5–51.0)
Hemoglobin: 15.7 g/dL (ref 13.0–17.7)
MCH: 35 pg — ABNORMAL HIGH (ref 26.6–33.0)
MCHC: 35.9 g/dL — ABNORMAL HIGH (ref 31.5–35.7)
MCV: 97 fL (ref 79–97)
Platelets: 176 10*3/uL (ref 150–450)
RBC: 4.49 x10E6/uL (ref 4.14–5.80)
RDW: 12.3 % (ref 11.6–15.4)
WBC: 6.6 10*3/uL (ref 3.4–10.8)

## 2022-03-15 LAB — MAGNESIUM: Magnesium: 2 mg/dL (ref 1.6–2.3)

## 2022-04-05 ENCOUNTER — Other Ambulatory Visit: Payer: Self-pay

## 2022-05-09 ENCOUNTER — Other Ambulatory Visit: Payer: Self-pay

## 2022-06-04 ENCOUNTER — Other Ambulatory Visit: Payer: Self-pay

## 2022-09-11 ENCOUNTER — Other Ambulatory Visit: Payer: Self-pay

## 2022-09-11 ENCOUNTER — Other Ambulatory Visit: Payer: Self-pay | Admitting: Cardiovascular Disease

## 2022-09-11 MED ORDER — ATORVASTATIN CALCIUM 80 MG PO TABS
80.0000 mg | ORAL_TABLET | Freq: Every day | ORAL | 1 refills | Status: DC
  Filled 2022-09-11: qty 90, 90d supply, fill #0
  Filled 2022-12-13: qty 90, 90d supply, fill #1

## 2022-09-11 MED ORDER — LOSARTAN POTASSIUM 25 MG PO TABS
12.5000 mg | ORAL_TABLET | Freq: Every day | ORAL | 1 refills | Status: DC
  Filled 2022-09-11: qty 45, 90d supply, fill #0
  Filled 2022-12-13: qty 45, 90d supply, fill #1

## 2022-09-11 MED ORDER — METOPROLOL TARTRATE 25 MG PO TABS
12.5000 mg | ORAL_TABLET | Freq: Two times a day (BID) | ORAL | 1 refills | Status: DC
  Filled 2022-09-11: qty 90, 90d supply, fill #0
  Filled 2022-12-13: qty 90, 90d supply, fill #1

## 2022-09-11 NOTE — Telephone Encounter (Signed)
Pt's medications were sent to pt's pharmacy as requested. Confirmation received.  

## 2022-12-13 ENCOUNTER — Other Ambulatory Visit: Payer: Self-pay

## 2022-12-17 ENCOUNTER — Other Ambulatory Visit: Payer: Self-pay

## 2023-03-25 ENCOUNTER — Other Ambulatory Visit: Payer: Self-pay | Admitting: Cardiovascular Disease

## 2023-03-25 ENCOUNTER — Other Ambulatory Visit: Payer: Self-pay

## 2023-03-25 MED ORDER — ATORVASTATIN CALCIUM 80 MG PO TABS
80.0000 mg | ORAL_TABLET | Freq: Every day | ORAL | 0 refills | Status: DC
  Filled 2023-03-25: qty 30, 30d supply, fill #0

## 2023-03-25 MED ORDER — METOPROLOL TARTRATE 25 MG PO TABS
12.5000 mg | ORAL_TABLET | Freq: Two times a day (BID) | ORAL | 0 refills | Status: DC
  Filled 2023-03-25: qty 30, 30d supply, fill #0

## 2023-03-25 MED ORDER — LOSARTAN POTASSIUM 25 MG PO TABS
12.5000 mg | ORAL_TABLET | Freq: Every day | ORAL | 0 refills | Status: DC
  Filled 2023-03-25: qty 45, 90d supply, fill #0

## 2023-03-26 ENCOUNTER — Other Ambulatory Visit: Payer: Self-pay

## 2023-04-25 ENCOUNTER — Ambulatory Visit: Payer: Self-pay | Attending: Cardiovascular Disease | Admitting: Cardiovascular Disease

## 2023-04-25 ENCOUNTER — Encounter: Payer: Self-pay | Admitting: Cardiovascular Disease

## 2023-04-25 ENCOUNTER — Other Ambulatory Visit: Payer: Self-pay

## 2023-04-25 VITALS — BP 106/70 | HR 67 | Ht 71.0 in | Wt 152.2 lb

## 2023-04-25 DIAGNOSIS — E782 Mixed hyperlipidemia: Secondary | ICD-10-CM

## 2023-04-25 DIAGNOSIS — I2510 Atherosclerotic heart disease of native coronary artery without angina pectoris: Secondary | ICD-10-CM

## 2023-04-25 DIAGNOSIS — Z720 Tobacco use: Secondary | ICD-10-CM

## 2023-04-25 DIAGNOSIS — I341 Nonrheumatic mitral (valve) prolapse: Secondary | ICD-10-CM

## 2023-04-25 MED ORDER — ATORVASTATIN CALCIUM 80 MG PO TABS
80.0000 mg | ORAL_TABLET | Freq: Every day | ORAL | 3 refills | Status: DC
  Filled 2023-04-25: qty 90, 90d supply, fill #0
  Filled 2023-07-29: qty 90, 90d supply, fill #1

## 2023-04-25 MED ORDER — NITROGLYCERIN 0.4 MG SL SUBL
SUBLINGUAL_TABLET | SUBLINGUAL | 6 refills | Status: AC
  Filled 2023-04-25: qty 25, 8d supply, fill #0

## 2023-04-25 NOTE — Patient Instructions (Signed)
Medication Instructions:  REFILLED Atorvastatin and Nitroglycerin *If you need a refill on your cardiac medications before your next appointment, please call your pharmacy*   Lab Work: CBC, CMET today If you have labs (blood work) drawn today and your tests are completely normal, you will receive your results only by: MyChart Message (if you have MyChart) OR A paper copy in the mail If you have any lab test that is abnormal or we need to change your treatment, we will call you to review the results.   Testing/Procedures: NONE   Follow-Up: At [facility], you and your health needs are our priority.  As part of our continuing mission to provide you with exceptional heart care, we have created designated Provider Care Teams.  These Care Teams include your primary Cardiologist (physician) and Advanced Practice Providers (APPs -  Physician Assistants and Nurse Practitioners) who all work together to provide you with the care you need, when you need it.  We recommend signing up for the patient portal called "MyChart".  Sign up information is provided on this After Visit Summary.  MyChart is used to connect with patients for Virtual Visits (Telemedicine).  Patients are able to view lab/test results, encounter notes, upcoming appointments, etc.  Non-urgent messages can be sent to your provider as well.   To learn more about what you can do with MyChart, go to [email or web address].    Your next appointment:   1 year(s)  Provider:   Tonny Bollman, MD

## 2023-04-25 NOTE — Progress Notes (Signed)
Cardiology Office Note:    Date:  04/25/2023   ID:  Eddie Joseph, DOB October 07, 1955, MRN [id number]  PCP:  Patient, No Pcp Per    [facility] Providers Cardiologist:  Tonny Bollman, MD Cardiology APP:  Kennon Rounds     Referring MD: No ref. provider found   Chief Complaint  Patient presents with   Coronary Artery Disease    History of Present Illness:    Eddie Joseph is a 67 y.o. male with a hx of coronary artery disease, presenting for follow-up evaluation.  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  He denies any exertional symptoms.  He specifically denies exertional chest pain or pressure.  He has no dyspnea with exertion.  He has an occasional left-sided chest discomfort that is fleeting and unchanged over time.  No orthopnea, PND, lightheadedness, heart palpitations, or edema.  Past Medical History:  Diagnosis Date   Coronary artery disease    Elevated alkaline phosphatase level    Hyperlipidemia    Iron deficiency    Myocardial infarction [facility]     Past Surgical History:  Procedure Laterality Date   CORONARY STENT INTERVENTION N/A 07/02/2020   Procedure: CORONARY STENT INTERVENTION;  Surgeon: Tonny Bollman, MD;  Location: [facility] INVASIVE CV LAB;  Service: Cardiovascular;  Laterality: N/A;   CORONARY/GRAFT ACUTE MI REVASCULARIZATION N/A 07/02/2020   Procedure: Coronary/Graft Acute MI Revascularization;  Surgeon: Tonny Bollman, MD;  Location: [facility] INVASIVE CV LAB;  Service: Cardiovascular;  Laterality: N/A;   LEFT HEART CATH AND CORONARY ANGIOGRAPHY N/A 07/02/2020   Procedure: LEFT HEART CATH AND CORONARY ANGIOGRAPHY;  Surgeon:  Tonny Bollman, MD;  Location: [facility] INVASIVE CV LAB;  Service: Cardiovascular;  Laterality: N/A;    Current Medications: Current Meds  Medication Sig   aspirin EC 81 MG tablet Take 81 mg by mouth daily. Swallow whole.   losartan (COZAAR) 25 MG tablet Take 0.5 tablets (12.5 mg total) by mouth daily.   metoprolol tartrate (LOPRESSOR) 25 MG tablet Take 0.5 tablets (12.5 mg total) by mouth 2 (two) times daily.   [DISCONTINUED] atorvastatin (LIPITOR) 80 MG tablet Take 1 tablet (80 mg total) by mouth daily.   [DISCONTINUED] nitroGLYCERIN (NITROSTAT) 0.4 MG SL tablet PLACE 1 TABLET (0.4 MG TOTAL) UNDER THE TONGUE EVERY FIVE MINUTES FOR 3 DOSES AS NEEDED FOR CHEST PAIN.     Allergies:   Patient has no known allergies.   Social History   Socioeconomic History   Marital status: Single    Spouse name: Not on file   Number of children: Not on file   Years of education: Not on file   Highest education level: Not on file  Occupational History   Not on file  Tobacco Use   Smoking status: Every Day    Current packs/day: 1.00    Types: Cigarettes   Smokeless tobacco: Never  Vaping Use   Vaping status: Never Used  Substance and Sexual Activity   Alcohol use: Yes    Alcohol/week: 10.0 standard drinks of alcohol    Types: 10 Cans of beer per week   Drug use: No   Sexual activity: Not on file  Other Topics Concern  Not on file  Social History Narrative   Not on file   Social Determinants of Health   Financial Resource Strain: Not on file  Food Insecurity: Not on file  Transportation Needs: Not on file  Physical Activity: Not on file  Stress: Not on file  Social Connections: Not on file     Family History: The patient's family history includes Cancer in his mother; Heart disease in his mother; Heart disease (age of onset: 34) in his father.  ROS:   Please see the history of present illness.    All other systems reviewed and are negative.  EKGs/Labs/Other Studies Reviewed:     The following studies were reviewed today: EKG Interpretation Date/Time:  Friday April 25 2023 08:05:48 EDT Ventricular Rate:  67 PR Interval:  164 QRS Duration:  94 QT Interval:  392 QTC Calculation: 414 R Axis:   90  Text Interpretation: Normal sinus rhythm Rightward axis Early repolarization Confirmed by Tonny Bollman [phone]) on 04/25/2023 8:17:02 AM    Recent Labs: No results found for requested labs within last 365 days.  Recent Lipid Panel    Component Value Date/Time   CHOL 125 03/14/2022 0732   TRIG 117 03/14/2022 0732   HDL 35 (L) 03/14/2022 0732   CHOLHDL 3.6 03/14/2022 0732   CHOLHDL 8.0 07/03/2020 0430   VLDL 39 07/03/2020 0430   LDLCALC 69 03/14/2022 0732     Risk Assessment/Calculations:                Physical Exam:    VS:  BP 106/70   Pulse 67   Ht 5\' 11"  (1.803 m)   Wt 152 lb 3.2 oz (69 kg)   SpO2 96%   BMI 21.23 kg/m     Wt Readings from Last 3 Encounters:  04/25/23 152 lb 3.2 oz (69 kg)  03/11/22 155 lb (70.3 kg)  07/25/21 157 lb 6.4 oz (71.4 kg)     GEN:  Well nourished, well developed in no acute distress HEENT: Normal NECK: No JVD; No carotid bruits LYMPHATICS: No lymphadenopathy CARDIAC: RRR, midsystolic click is present, 2/6 late systolic murmur is present RESPIRATORY:  Clear to auscultation without rales, wheezing or rhonchi  ABDOMEN: Soft, non-tender, non-distended MUSCULOSKELETAL:  No edema; No deformity  SKIN: Warm and dry NEUROLOGIC:  Alert and oriented x 3 PSYCHIATRIC:  Normal affect   ASSESSMENT:    1. Mitral valve prolapse   2. Coronary artery disease involving native coronary artery of native heart without angina pectoris   3. Tobacco abuse   4. Mixed hyperlipidemia    PLAN:    In order of problems listed above:  The patient's exam is consistent with mitral valve prolapse with mitral regurgitation.  He remains clinically stable and asymptomatic.  Continue low-dose metoprolol.  Follow-up 1 year.  Previous  echo from 2022 reviewed with normal LVEF of 60 to 65%, moderate bileaflet prolapse of the mitral valve with mild to moderate mitral regurgitation. Stable without angina.  Continue aspirin and high intensity statin drug. Cessation counseling done.  He continues to smoke just a few cigarettes per day.  Complete cessation advised. Treated with a high intensity statin drug.  Last lipids with cholesterol 125, HDL 35, LDL 69.      Medication Adjustments/Labs and Tests Ordered: Current medicines are reviewed at length with the patient today.  Concerns regarding medicines are outlined above.  Orders Placed This Encounter  Procedures   CBC   Comprehensive metabolic panel   EKG  12-Lead   Meds ordered this encounter  Medications   atorvastatin (LIPITOR) 80 MG tablet    Sig: Take 1 tablet (80 mg total) by mouth daily.    Dispense:  90 tablet    Refill:  3   nitroGLYCERIN (NITROSTAT) 0.4 MG SL tablet    Sig: Dissolve 1 tablet under the tongue every 5 minutes as needed for chest pain. Max of 3 doses, then 911.    Dispense:  25 tablet    Refill:  6    Patient Instructions  Medication Instructions:  REFILLED Atorvastatin and Nitroglycerin *If you need a refill on your cardiac medications before your next appointment, please call your pharmacy*   Lab Work: CBC, CMET today If you have labs (blood work) drawn today and your tests are completely normal, you will receive your results only by: MyChart Message (if you have MyChart) OR A paper copy in the mail If you have any lab test that is abnormal or we need to change your treatment, we will call you to review the results.   Testing/Procedures: NONE   Follow-Up: At [facility], you and your health needs are our priority.  As part of our continuing mission to provide you with exceptional heart care, we have created designated Provider Care Teams.  These Care Teams include your primary Cardiologist (physician) and Advanced Practice  Providers (APPs -  Physician Assistants and Nurse Practitioners) who all work together to provide you with the care you need, when you need it.  We recommend signing up for the patient portal called "MyChart".  Sign up information is provided on this After Visit Summary.  MyChart is used to connect with patients for Virtual Visits (Telemedicine).  Patients are able to view lab/test results, encounter notes, upcoming appointments, etc.  Non-urgent messages can be sent to your provider as well.   To learn more about what you can do with MyChart, go to [email or web address].    Your next appointment:   1 year(s)  Provider:   Tonny Bollman, MD        Signed, Tonny Bollman, MD  04/25/2023 12:30 PM    [facility]

## 2023-04-26 LAB — COMPREHENSIVE METABOLIC PANEL
ALT: 24 IU/L (ref 0–44)
AST: 18 IU/L (ref 0–40)
Albumin: 4.3 g/dL (ref 3.9–4.9)
Alkaline Phosphatase: 121 IU/L (ref 44–121)
BUN/Creatinine Ratio: 16 (ref 10–24)
BUN: 16 mg/dL (ref 8–27)
Bilirubin Total: 0.3 mg/dL (ref 0.0–1.2)
CO2: 25 mmol/L (ref 20–29)
Calcium: 9.4 mg/dL (ref 8.6–10.2)
Chloride: 105 mmol/L (ref 96–106)
Creatinine, Ser: 0.99 mg/dL (ref 0.76–1.27)
Globulin, Total: 2.4 g/dL (ref 1.5–4.5)
Glucose: 88 mg/dL (ref 70–99)
Potassium: 4.7 mmol/L (ref 3.5–5.2)
Sodium: 140 mmol/L (ref 134–144)
Total Protein: 6.7 g/dL (ref 6.0–8.5)
eGFR: 84 mL/min/{1.73_m2} (ref >59)

## 2023-04-26 LAB — CBC
Hematocrit: 44.4 % (ref 37.5–51.0)
Hemoglobin: 15.1 g/dL (ref 13.0–17.7)
MCH: 33.3 pg — ABNORMAL HIGH (ref 26.6–33.0)
MCHC: 34 g/dL (ref 31.5–35.7)
MCV: 98 fL — ABNORMAL HIGH (ref 79–97)
Platelets: 193 10*3/uL (ref 150–450)
RBC: 4.53 x10E6/uL (ref 4.14–5.80)
RDW: 12.2 % (ref 11.6–15.4)
WBC: 6.5 10*3/uL (ref 3.4–10.8)

## 2023-07-29 ENCOUNTER — Other Ambulatory Visit: Payer: Self-pay

## 2023-07-29 ENCOUNTER — Telehealth: Payer: Self-pay | Admitting: Cardiovascular Disease

## 2023-07-29 ENCOUNTER — Other Ambulatory Visit: Payer: Self-pay | Admitting: Cardiovascular Disease

## 2023-07-29 DIAGNOSIS — I2510 Atherosclerotic heart disease of native coronary artery without angina pectoris: Secondary | ICD-10-CM

## 2023-07-29 DIAGNOSIS — I341 Nonrheumatic mitral (valve) prolapse: Secondary | ICD-10-CM

## 2023-07-29 MED ORDER — ATORVASTATIN CALCIUM 80 MG PO TABS
80.0000 mg | ORAL_TABLET | Freq: Every day | ORAL | 2 refills | Status: DC
  Filled 2023-10-30: qty 90, 90d supply, fill #0
  Filled 2024-02-03: qty 90, 90d supply, fill #1
  Filled 2024-05-17: qty 90, 90d supply, fill #2

## 2023-07-29 NOTE — Telephone Encounter (Signed)
*  STAT* If patient is at the pharmacy, call can be transferred to refill team.   1. Which medications need to be refilled? (please list name of each medication and dose if known)  atorvastatin (LIPITOR) 80 MG tablet   losartan (COZAAR) 25 MG tablet    metoprolol tartrate (LOPRESSOR) 25 MG tablet    2. Which pharmacy/location (including street and city if local pharmacy) is medication to be sent to? [facility] - [facility]   3. Do they need a 30 day or 90 day supply? 90

## 2023-07-29 NOTE — Telephone Encounter (Signed)
 Rx sent to requested Pharmacy.

## 2023-07-30 ENCOUNTER — Other Ambulatory Visit: Payer: Self-pay

## 2023-08-01 ENCOUNTER — Other Ambulatory Visit: Payer: Self-pay

## 2023-08-01 MED ORDER — METOPROLOL TARTRATE 25 MG PO TABS
12.5000 mg | ORAL_TABLET | Freq: Two times a day (BID) | ORAL | 2 refills | Status: DC
  Filled 2023-08-01: qty 90, 90d supply, fill #0
  Filled 2023-10-30: qty 90, 90d supply, fill #1
  Filled 2024-02-03: qty 90, 90d supply, fill #2

## 2023-08-01 MED ORDER — LOSARTAN POTASSIUM 25 MG PO TABS
12.5000 mg | ORAL_TABLET | Freq: Every day | ORAL | 2 refills | Status: AC
  Filled 2023-08-01: qty 45, 90d supply, fill #0
  Filled 2023-10-30: qty 45, 90d supply, fill #1
  Filled 2024-05-17: qty 45, 90d supply, fill #2

## 2023-10-30 ENCOUNTER — Other Ambulatory Visit: Payer: Self-pay

## 2024-02-03 ENCOUNTER — Other Ambulatory Visit: Payer: Self-pay

## 2024-05-17 ENCOUNTER — Other Ambulatory Visit: Payer: Self-pay | Admitting: Cardiovascular Disease

## 2024-05-17 ENCOUNTER — Other Ambulatory Visit: Payer: Self-pay

## 2024-05-18 ENCOUNTER — Other Ambulatory Visit: Payer: Self-pay

## 2024-05-18 MED ORDER — METOPROLOL TARTRATE 25 MG PO TABS
12.5000 mg | ORAL_TABLET | Freq: Two times a day (BID) | ORAL | 0 refills | Status: DC
  Filled 2024-05-18: qty 60, 60d supply, fill #0

## 2024-05-24 ENCOUNTER — Other Ambulatory Visit: Payer: Self-pay

## 2024-08-23 ENCOUNTER — Telehealth: Payer: Self-pay | Admitting: Cardiovascular Disease

## 2024-08-23 DIAGNOSIS — I2510 Atherosclerotic heart disease of native coronary artery without angina pectoris: Secondary | ICD-10-CM

## 2024-08-23 DIAGNOSIS — I341 Nonrheumatic mitral (valve) prolapse: Secondary | ICD-10-CM

## 2024-08-23 NOTE — Telephone Encounter (Signed)
" °*  STAT* If patient is at the pharmacy, call can be transferred to refill team.   1. Which medications need to be refilled? (please list name of each medication and dose if known)   atorvastatin  (LIPITOR ) 80 MG tablet  metoprolol  tartrate (LOPRESSOR ) 25 MG tablet   2. Would you like to learn more about the convenience, safety, & potential cost savings by using the [facility]?   3. Are you open to using the [facility] (Type [facility]. ).  4. Which pharmacy/location (including street and city if local pharmacy) is medication to be sent to?  [facility] - [facility]   5. Do they need a 30 day or 90 day supply?   90 day  Patient stated he is completely out/almost out of these medications.  Patient on Wait List to see Dr. Wonda. "

## 2024-08-25 ENCOUNTER — Other Ambulatory Visit: Payer: Self-pay

## 2024-08-25 MED ORDER — METOPROLOL TARTRATE 25 MG PO TABS
12.5000 mg | ORAL_TABLET | Freq: Two times a day (BID) | ORAL | 0 refills | Status: AC
  Filled 2024-08-25: qty 180, 180d supply, fill #0

## 2024-08-25 MED ORDER — ATORVASTATIN CALCIUM 80 MG PO TABS
80.0000 mg | ORAL_TABLET | Freq: Every day | ORAL | 0 refills | Status: AC
  Filled 2024-08-25: qty 90, 90d supply, fill #0

## 2024-08-25 NOTE — Telephone Encounter (Signed)
 Requested Prescriptions   Signed Prescriptions Disp Refills   atorvastatin  (LIPITOR ) 80 MG tablet 90 tablet 0    Sig: Take 1 tablet (80 mg total) by mouth daily.    Authorizing Provider: WONDA SHARPER    Ordering User: [name]   metoprolol  tartrate (LOPRESSOR ) 25 MG tablet 180 tablet 0    Sig: Take 0.5 tablets (12.5 mg total) by mouth 2 (two) times daily.Please call office to schedule appt with Dr Wonda for further refills. [phone] Thank you 1st Attempt    Authorizing Provider: WONDA SHARPER    Ordering User: Eddie Joseph, Eddie Seawright  Joseph

## 2024-09-24 ENCOUNTER — Encounter: Payer: Self-pay | Admitting: Cardiovascular Disease

## 2024-09-24 ENCOUNTER — Ambulatory Visit: Payer: Self-pay | Attending: Cardiovascular Disease | Admitting: Cardiovascular Disease

## 2024-09-24 VITALS — BP 131/68 | HR 76 | Ht 71.0 in | Wt 159.0 lb

## 2024-09-24 DIAGNOSIS — Z720 Tobacco use: Secondary | ICD-10-CM

## 2024-09-24 DIAGNOSIS — E782 Mixed hyperlipidemia: Secondary | ICD-10-CM

## 2024-09-24 DIAGNOSIS — I341 Nonrheumatic mitral (valve) prolapse: Secondary | ICD-10-CM

## 2024-09-24 DIAGNOSIS — I2510 Atherosclerotic heart disease of native coronary artery without angina pectoris: Secondary | ICD-10-CM

## 2024-09-24 NOTE — Progress Notes (Signed)
 " Cardiology Office Note:    Date:  09/24/2024   ID:  SLATON REASER, DOB [birth year], MRN [id number]  PCP:  Patient, No Pcp Per   [facility] Providers Cardiologist:  Ozell Fell, MD Cardiology APP:  Lelon Glendia ONEIDA DEVONNA     Referring MD: No ref. provider found   Chief Complaint  Patient presents with   Coronary Artery Disease    History of Present Illness:    Eddie Joseph is a 69 y.o. 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  He reports that he is doing fine.  He denies any chest pain, chest pressure, or shortness of breath.  No edema, heart palpitations, orthopnea, or PND.  He remains compliant with his medications.  He has cut back on smoking but continues to smoke cigarettes.  Current Medications: Active Medications[1]   Allergies:   Patient has no known allergies.   ROS:   Please see the history of present illness.    All other systems reviewed and are negative.  EKGs/Labs/Other Studies Reviewed:    The following studies were reviewed today: Cardiac Studies & Procedures   ______________________________________________________________________________________________ CARDIAC CATHETERIZATION  CARDIAC CATHETERIZATION 07/02/2020  Conclusion 1.  Total occlusion of the proximal RCA, treated successfully with primary PCI using a 3.5 x 38 mm resolute Onyx DES 2.  Moderate ostial and proximal LAD stenoses, 50 to 70% range luminal narrowing 3.  Patent left main and left circumflex with minimal luminal irregularities 4.  Normal LV systolic function with normal LVEDP 5.  Cardiogenic shock treated with vasopressor therapy and reperfusion  therapy  Recommendations: ICU care, dual antiplatelet therapy with aspirin  and ticagrelor  at least 12 months (ticagrelor  180 mg administered orally at the end of the procedure), tirofiban  x6 hours, low-dose norepinephrine  infusion at completion of procedure titrate to MAP greater than 65 or SBP greater than 90, IV amiodarone  for treatment of atrial fibrillation  Findings Coronary Findings Diagnostic  Dominance: Right  Left Anterior Descending Ost LAD to Prox LAD lesion is 50% stenosed. Ostial lesion, likely a component of catheter induced vasospasm Mid LAD lesion is 70% stenosed. Focal lesion  Left Circumflex The vessel exhibits minimal luminal irregularities. Patent vessel with minimal nonobstructive disease  Right Coronary Artery Prox RCA lesion is 100% stenosed. The lesion is heavily thrombotic.  Intervention  Prox RCA lesion Stent CATH LAUNCHER 6FR JR4 guide catheter was inserted. Lesion crossed with guidewire using a WIRE COUGAR XT STRL 190CM. Pre-stent angioplasty was performed using a BALLOON SAPPHIRE 2.5X12. Maximum pressure:  6 atm. A drug-eluting stent was successfully placed using a STENT RESOLUTE ONYX 3.5X38. Maximum pressure: 12 atm. Post-stent angioplasty was performed using a BALLOON SAPPHIRE [place] [id number]. Maximum pressure:  14 atm. Post-Intervention Lesion Assessment The intervention was successful. Pre-interventional TIMI flow is 0. Post-intervention TIMI flow is 3. No complications occurred at this lesion. There is a 0% residual stenosis post intervention.     ECHOCARDIOGRAM  ECHOCARDIOGRAM COMPLETE 04/02/2021  Narrative ECHOCARDIOGRAM REPORT    Patient Name:   Eddie Joseph Date of Exam: 04/02/2021 Medical Rec #:  [id number]       Height:       71.0 in Accession #:  [id number]      Weight:       149.8 lb Date of Birth:  1956/06/02       BSA:          1.865 m Patient Age:    64 years        BP:           120/80 mmHg Patient Gender: M               HR:            77 bpm. Exam Location:  [address]  Procedure: 2D Echo, 3D Echo, Cardiac Doppler, Color Doppler and Strain Analysis  Indications:    R01.1 Murmur  History:        Patient has prior history of Echocardiogram examinations, most recent 07/02/2020. Previous Myocardial Infarction and CAD; Risk Factors:Dyslipidemia.  Sonographer:    Carl Rodgers-Jones RDCS Referring Phys: [id number] Aislinn Feliz  IMPRESSIONS   1. Left ventricular ejection fraction, by estimation, is 60 to 65%. Left ventricular ejection fraction by 3D volume is 63 %. The left ventricle has normal function. The left ventricle has no regional wall motion abnormalities. There is mild left ventricular hypertrophy. Left ventricular diastolic parameters were normal. The average left ventricular global longitudinal strain is -21.0 %. 2. Right ventricular systolic function is normal. The right ventricular size is normal. There is normal pulmonary artery systolic pressure. 3. The mitral valve is abnormal. Mild to moderate mitral valve regurgitation. There is moderate prolapse of both leaflets of the mitral valve. 4. The aortic valve is tricuspid. Aortic valve regurgitation is not visualized. No aortic stenosis is present. 5. The inferior vena cava is normal in size with greater than 50% respiratory variability, suggesting right atrial pressure of 3 mmHg.  FINDINGS Left Ventricle: Left ventricular ejection fraction, by estimation, is 60 to 65%. Left ventricular ejection fraction by 3D volume is 63 %. The left ventricle has normal function. The left ventricle has no regional wall motion abnormalities. The average left ventricular global longitudinal strain is -21.0 %. The left ventricular internal cavity size was normal in size. There is mild left ventricular hypertrophy. Left ventricular diastolic parameters were normal.  Right Ventricle: The right ventricular size is normal. No increase in right ventricular wall thickness. Right  ventricular systolic function is normal. There is normal pulmonary artery systolic pressure. The tricuspid regurgitant velocity is 1.83 m/s, and with an assumed right atrial pressure of 3 mmHg, the estimated right ventricular systolic pressure is 16.4 mmHg.  Left Atrium: Left atrial size was normal in size.  Right Atrium: Right atrial size was normal in size.  Pericardium: There is no evidence of pericardial effusion.  Mitral Valve: The mitral valve is abnormal. There is moderate prolapse of both leaflets of the mitral valve. Mild to moderate mitral valve regurgitation.  Tricuspid Valve: The tricuspid valve is normal in structure. Tricuspid valve regurgitation is trivial.  Aortic Valve: The aortic valve is tricuspid. Aortic valve regurgitation is not visualized. No aortic stenosis is present.  Pulmonic Valve: The pulmonic valve was not well visualized. Pulmonic valve regurgitation is not visualized.  Aorta: The aortic root is normal in size and structure.  Venous: The inferior vena cava is normal in size with greater than 50% respiratory variability, suggesting right atrial pressure of 3 mmHg.  IAS/Shunts: The interatrial septum was not well visualized.   LEFT VENTRICLE PLAX 2D LVIDd:         4.50 cm  Diastology LVIDs:         2.80 cm         LV e' medial:    7.72 cm/s LV PW:         0.90 cm         LV E/e' medial:  12.4 LV IVS:        0.80 cm         LV e' lateral:   8.05 cm/s LVOT diam:     2.10 cm         LV E/e' lateral: 11.9 LV SV:         63 LV SV Index:   34              2D LVOT Area:     3.46 cm        Longitudinal Strain 2D Strain GLS  -18.1 % (A2C): 2D Strain GLS  -20.4 % (A3C): 2D Strain GLS  -24.5 % (A4C): 2D Strain GLS  -21.0 % Avg:  3D Volume EF LV 3D EF:    Left ventricul ar ejection fraction by 3D volume is 63 %.  3D Volume EF: 3D EF:        63 % LV EDV:       113 ml LV ESV:       41 ml LV SV:        71 ml  RIGHT VENTRICLE RV Basal  diam:  3.70 cm RV S prime:     13.45 cm/s TAPSE (M-mode): 1.9 cm  LEFT ATRIUM             Index       RIGHT ATRIUM          Index LA diam:        4.00 cm 2.14 cm/m  RA Area:     9.62 cm LA Vol (A2C):   46.0 ml 24.67 ml/m RA Volume:   21.80 ml 11.69 ml/m LA Vol (A4C):   39.8 ml 21.34 ml/m LA Biplane Vol: 43.1 ml 23.11 ml/m AORTIC VALVE LVOT Vmax:   86.60 cm/s LVOT Vmean:  57.550 cm/s LVOT VTI:    0.182 m  AORTA Ao Root diam: 3.80 cm Ao Asc diam:  3.40 cm  MITRAL VALVE                TRICUSPID VALVE MV Area (PHT): 3.27 cm     TR Peak grad:   13.4 mmHg MV Decel Time: 232 msec     TR Vmax:        183.00 cm/s MR Peak grad: 122.3 mmHg MR Mean grad: 89.0 mmHg     SHUNTS MR Vmax:      553.00 cm/s   Systemic VTI:  0.18 m MR Vmean:     452.0 cm/s    Systemic Diam: 2.10 cm MV E velocity: 95.50 cm/s MV A velocity: 108.00 cm/s MV E/A ratio:  0.88  Lonni Nanas MD Electronically signed by Lonni Nanas MD Signature Date/Time: 04/02/2021/11:00:28 AM    Final          ______________________________________________________________________________________________      EKG:   EKG Interpretation Date/Time:  Friday September 24 2024 16:36:25 EST Ventricular Rate:  76 PR Interval:  160 QRS Duration:  88 QT Interval:  374 QTC Calculation: 420 R Axis:   89  Text Interpretation: Normal sinus rhythm Normal ECG When compared with ECG of 25-Apr-2023 08:05, No significant change was found Confirmed by Wonda Sharper [phone]) on  09/24/2024 4:42:13 PM    Recent Labs: No results found for requested labs within last 365 days.  Recent Lipid Panel    Component Value Date/Time   CHOL 125 03/14/2022 0732   TRIG 117 03/14/2022 0732   HDL 35 (L) 03/14/2022 0732   CHOLHDL 3.6 03/14/2022 0732   CHOLHDL 8.0 07/03/2020 0430   VLDL 39 07/03/2020 0430   LDLCALC 69 03/14/2022 0732     Risk Assessment/Calculations:                Physical Exam:    VS:  BP 131/68    Pulse 76   Ht 5' 11 (1.803 m)   Wt 159 lb (72.1 kg)   SpO2 97%   BMI 22.18 kg/m     Wt Readings from Last 3 Encounters:  09/24/24 159 lb (72.1 kg)  04/25/23 152 lb 3.2 oz (69 kg)  03/11/22 155 lb (70.3 kg)     GEN:  Well nourished, well developed in no acute distress HEENT: Normal NECK: No JVD; No carotid bruits LYMPHATICS: No lymphadenopathy CARDIAC: RRR, no murmurs, rubs, gallops RESPIRATORY:  Clear to auscultation without rales, wheezing or rhonchi  ABDOMEN: Soft, non-tender, non-distended MUSCULOSKELETAL:  No edema; No deformity  SKIN: Warm and dry NEUROLOGIC:  Alert and oriented x 3 PSYCHIATRIC:  Normal affect   Assessment & Plan Mitral valve prolapse Patient's exam suggest mitral valve prolapse with moderate mitral regurgitation.  I recommended an echocardiogram for surveillance.  His last echocardiogram was greater than 3 years ago. Coronary artery disease involving native coronary artery of native heart without angina pectoris Patient now 5 years out from his acute inferior wall MI.  Continue aspirin , atorvastatin , losartan , and metoprolol .  No anginal symptoms.  Follow-up 1 year. Tobacco abuse Cessation counseling done Mixed hyperlipidemia Treated with atorvastatin  80 mg daily.  Goal LDL cholesterol less than 55 mg/dL.  Patient is due for labs will order lipid panel, LFTs for surveillance.            Medication Adjustments/Labs and Tests Ordered: Current medicines are reviewed at length with the patient today.  Concerns regarding medicines are outlined above.  Orders Placed This Encounter  Procedures   CBC   Comprehensive metabolic panel with GFR   Lipid panel   EKG 12-Lead   ECHOCARDIOGRAM COMPLETE   No orders of the defined types were placed in this encounter.   Patient Instructions  Medication Instructions:  No medication changes were made at this visit. Continue current regimen.   *If you need a refill on your cardiac medications before your  next appointment, please call your pharmacy*  Lab Work: To be completed next week: lipid panel, CBC, CMP  If you have labs (blood work) drawn today and your tests are completely normal, you will receive your results only by: MyChart Message (if you have MyChart) OR A paper copy in the mail If you have any lab test that is abnormal or we need to change your treatment, we will call you to review the results.  Testing/Procedures: Your physician has requested that you have an echocardiogram. Echocardiography is a painless test that uses sound waves to create images of your heart. It provides your doctor with information about the size and shape of your heart and how well your hearts chambers and valves are working. This procedure takes approximately one hour. There are no restrictions for this procedure. Please do NOT wear cologne, perfume, aftershave, or lotions (deodorant is allowed). Please arrive 15 minutes prior  to your appointment time.  Please note: We ask at that you not bring children with you during ultrasound (echo/ vascular) testing. Due to room size and safety concerns, children are not allowed in the ultrasound rooms during exams. Our front office staff cannot provide observation of children in our lobby area while testing is being conducted. An adult accompanying a patient to their appointment will only be allowed in the ultrasound room at the discretion of the ultrasound technician under special circumstances. We apologize for any inconvenience.   Follow-Up: At [facility], you and your health needs are our priority.  As part of our continuing mission to provide you with exceptional heart care, our providers are all part of one team.  This team includes your primary Cardiologist (physician) and Advanced Practice Providers or APPs (Physician Assistants and Nurse Practitioners) who all work together to provide you with the care you need, when you need it.  Your next  appointment:   1 year(s)  Provider:   Ozell Fell, MD      Signed, Ozell Fell, MD  09/24/2024 5:17 PM    [facility]     [1]  Current Meds  Medication Sig   aspirin  EC 81 MG tablet Take 81 mg by mouth daily. Swallow whole.   atorvastatin  (LIPITOR ) 80 MG tablet Take 1 tablet (80 mg total) by mouth daily.   losartan  (COZAAR ) 25 MG tablet Take 0.5 tablets (12.5 mg total) by mouth daily.   metoprolol  tartrate (LOPRESSOR ) 25 MG tablet Take 0.5 tablets (12.5 mg total) by mouth 2 (two) times daily.Please call office to schedule appt with Dr Fell for further refills. [phone] Thank you 1st Attempt   nitroGLYCERIN  (NITROSTAT ) 0.4 MG SL tablet Dissolve 1 tablet under the tongue every 5 minutes as needed for chest pain. Max of 3 doses, then 911.   "

## 2024-09-24 NOTE — Assessment & Plan Note (Signed)
Cessation counseling done 

## 2024-09-24 NOTE — Assessment & Plan Note (Addendum)
 Patient now 5 years out from his acute inferior wall MI.  Continue aspirin , atorvastatin , losartan , and metoprolol .  No anginal symptoms.  Follow-up 1 year.

## 2024-09-24 NOTE — Assessment & Plan Note (Signed)
 Treated with atorvastatin  80 mg daily.  Goal LDL cholesterol less than 55 mg/dL.  Patient is due for labs will order lipid panel, LFTs for surveillance.

## 2024-09-24 NOTE — Patient Instructions (Signed)
 Medication Instructions:  No medication changes were made at this visit. Continue current regimen.   *If you need a refill on your cardiac medications before your next appointment, please call your pharmacy*  Lab Work: To be completed next week: lipid panel, CBC, CMP  If you have labs (blood work) drawn today and your tests are completely normal, you will receive your results only by: MyChart Message (if you have MyChart) OR A paper copy in the mail If you have any lab test that is abnormal or we need to change your treatment, we will call you to review the results.  Testing/Procedures: Your physician has requested that you have an echocardiogram. Echocardiography is a painless test that uses sound waves to create images of your heart. It provides your doctor with information about the size and shape of your heart and how well your hearts chambers and valves are working. This procedure takes approximately one hour. There are no restrictions for this procedure. Please do NOT wear cologne, perfume, aftershave, or lotions (deodorant is allowed). Please arrive 15 minutes prior to your appointment time.  Please note: We ask at that you not bring children with you during ultrasound (echo/ vascular) testing. Due to room size and safety concerns, children are not allowed in the ultrasound rooms during exams. Our front office staff cannot provide observation of children in our lobby area while testing is being conducted. An adult accompanying a patient to their appointment will only be allowed in the ultrasound room at the discretion of the ultrasound technician under special circumstances. We apologize for any inconvenience.   Follow-Up: At [facility], you and your health needs are our priority.  As part of our continuing mission to provide you with exceptional heart care, our providers are all part of one team.  This team includes your primary Cardiologist (physician) and Advanced Practice  Providers or APPs (Physician Assistants and Nurse Practitioners) who all work together to provide you with the care you need, when you need it.  Your next appointment:   1 year(s)  Provider:   Ozell Fell, MD

## 2024-09-28 ENCOUNTER — Ambulatory Visit (HOSPITAL_COMMUNITY)
Admission: RE | Admit: 2024-09-28 | Discharge: 2024-09-28 | Payer: Self-pay | Attending: Cardiovascular Disease | Admitting: Cardiovascular Disease

## 2024-09-28 DIAGNOSIS — I341 Nonrheumatic mitral (valve) prolapse: Secondary | ICD-10-CM

## 2024-09-28 LAB — ECHOCARDIOGRAM COMPLETE
Area-P 1/2: 2.91 cm2
S' Lateral: 2.7 cm

## 2024-09-30 ENCOUNTER — Ambulatory Visit: Payer: Self-pay | Admitting: Cardiovascular Disease
